# Patient Record
Sex: Male | Born: 1990 | Hispanic: No | Marital: Single | Smoking: Never smoker
Health system: Southern US, Community
[De-identification: ages and names within clinical notes are randomized; demographics above are authoritative.]

## PROBLEM LIST (undated history)

## (undated) DIAGNOSIS — F329 Major depressive disorder, single episode, unspecified: Secondary | ICD-10-CM

## (undated) DIAGNOSIS — F32A Depression, unspecified: Secondary | ICD-10-CM

## (undated) DIAGNOSIS — F419 Anxiety disorder, unspecified: Secondary | ICD-10-CM

## (undated) HISTORY — DX: Anxiety disorder, unspecified: F41.9

## (undated) HISTORY — DX: Depression, unspecified: F32.A

## (undated) HISTORY — DX: Major depressive disorder, single episode, unspecified: F32.9

---
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
## 2016-02-21 ENCOUNTER — Encounter (HOSPITAL_COMMUNITY): Payer: Self-pay | Admitting: Psychiatry

## 2016-02-21 ENCOUNTER — Other Ambulatory Visit (HOSPITAL_COMMUNITY): Payer: BLUE CROSS/BLUE SHIELD | Attending: Psychiatry | Admitting: Licensed Clinical Social Worker

## 2016-02-21 DIAGNOSIS — F419 Anxiety disorder, unspecified: Secondary | ICD-10-CM | POA: Insufficient documentation

## 2016-02-21 DIAGNOSIS — F331 Major depressive disorder, recurrent, moderate: Secondary | ICD-10-CM | POA: Insufficient documentation
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
## 2016-02-21 NOTE — Progress Notes (Signed)
    Daily Group Progress Note  Program: IOP  Group Time: 9:00-11:00  Participation Level: Active  Behavioral Response: Appropriate  Type of Therapy:  Group Therapy  Summary of Progress: Pt. Was alert, smiled appropriately, engaged in the group process. Pt. Participated in discussion about wellness habits including sleep hygiene, nutrition, moderate exercise, and medication management. Pt. Shared with the group that he had bariatric surgery 1 1/2 years ago and how managing emotions withhold food post-surgery has been a significant challenge.       Shaune PollackBrown, Jennifer B, LPC

## 2016-02-21 NOTE — Progress Notes (Signed)
Daily Group Progress Note  Program: IOP  Group Time: 10:45-12:00pm  Participation Level: Active  Behavioral Response: Appropriate  Type of Therapy:  Psychotherapy  Summary of Progress:  Pt was new in group today.  Pt participated in a discussion on healthy boundaries. Pt was encouraged to set personal boundaries with limits and rules, which encourages mental wellness.

## 2016-02-22 ENCOUNTER — Other Ambulatory Visit (HOSPITAL_COMMUNITY): Payer: BLUE CROSS/BLUE SHIELD | Attending: Medical | Admitting: Licensed Clinical Social Worker

## 2016-02-22 DIAGNOSIS — F331 Major depressive disorder, recurrent, moderate: Secondary | ICD-10-CM | POA: Insufficient documentation

## 2016-02-22 NOTE — Progress Notes (Signed)
Daily Group Progress Note  Program: IOP  Group Time: 10:45-12:00pm  Participation Level: Active  Behavioral Response: Appropriate  Type of Therapy:  Psychotherapy  Summary of Progress:  Pt participated in chair yoga, facilitated by [facility] therapist and certified yoga instructor Leanne Yates. The activity focused on breath with movement. For patients with anxiety and depression as part of self-care and mindfulness, yoga helps train the relaxation response.    Lisbeth S. Mackenzie, LCAS-A   

## 2016-02-22 NOTE — Progress Notes (Signed)
    Daily Group Progress Note  Program: IOP   Time:9:00-10:45 Activity Level: active Behavioral Response: engaged, responsive Type: Group Therapy Summary: Pt. demonstrated a theme of anxiety today. He also shared his anxiety of how he is perceived in the gym, and the affects of living in a co- dependent family. Patient stated that he did not enjoy anything in his current environment. Therapist concluded that he desired joy in his life. Therapist shared that we should experience a full range of emotions and not get stuck in any of them.   Boneta LucksJennifer Lynnette Pote, Ph.D., [facility]

## 2016-02-23 ENCOUNTER — Other Ambulatory Visit (HOSPITAL_COMMUNITY): Payer: BLUE CROSS/BLUE SHIELD | Admitting: Psychiatry

## 2016-02-23 DIAGNOSIS — F331 Major depressive disorder, recurrent, moderate: Secondary | ICD-10-CM | POA: Diagnosis not present

## 2016-02-24 ENCOUNTER — Other Ambulatory Visit (HOSPITAL_COMMUNITY): Payer: BLUE CROSS/BLUE SHIELD | Admitting: Psychiatry

## 2016-02-24 DIAGNOSIS — F331 Major depressive disorder, recurrent, moderate: Secondary | ICD-10-CM | POA: Diagnosis not present

## 2016-02-24 NOTE — Progress Notes (Signed)
    Daily Group Progress Note  Program: IOP     Group Time: 1100-1200  Participation Level:  Active  Behavioral Response: Appropriate and Sharing  Type of Therapy: Psycho-education Group  Summary of Progress: Feelings Jenga:  Pt actively participated in the game.  He pulled Jenga blocks, read the emotion listed and shared a time when he felt that emotion.  Pt recalled a time he felt ashamed of himself.  He mentioned before he had the wt surgery he felt so ashamed of his body.  "I was wearing a size 5X shirt.  I'm down to a 3X but I want to continue to lose even more."  CLARK, RITA, M.Ed, CNA

## 2016-02-27 ENCOUNTER — Other Ambulatory Visit (HOSPITAL_COMMUNITY): Payer: BLUE CROSS/BLUE SHIELD | Admitting: Licensed Clinical Social Worker

## 2016-02-27 DIAGNOSIS — F331 Major depressive disorder, recurrent, moderate: Secondary | ICD-10-CM

## 2016-02-27 NOTE — Progress Notes (Signed)
    Daily Group Progress Note  Program: IOP  Time: 9:00-12:00 Activity Level: active Behavioral Response: engaged, responsive Type: Group Therapy Summary: Patient presented with a positive theme in group today. Patient shared that he wanted to change his career, but did not know what he would pursue. He also stated that he had a goal to go back to school, but he was worried about the money. Therapist shared about self care, and how to incorporate that in our daily life. Therapist also educated group about I statements.   Shaune Pollack, LPC

## 2016-02-28 ENCOUNTER — Other Ambulatory Visit (HOSPITAL_COMMUNITY): Payer: BLUE CROSS/BLUE SHIELD | Admitting: Licensed Clinical Social Worker

## 2016-02-28 DIAGNOSIS — F331 Major depressive disorder, recurrent, moderate: Secondary | ICD-10-CM

## 2016-02-28 NOTE — Progress Notes (Signed)
    Daily Group Progress Note  Program: IOP  Time: 9:00-12:00 Activity Level: active Behavioral Response: engaged, responsive Type: Group Therapy Summary: Patient is very insightful in group. Patient shared that he has low self esteem, and often feels inadequate in making decisions. Therapist encouraged group with positive psychology techniques, to incorporate positivity into their daily lives.  Shaune Pollack, LPC

## 2016-02-28 NOTE — Progress Notes (Signed)
Daily Group Progress Note       IOP    Group Time: 9:00-12:00  Activity Level: active  Behavioral Response: engaged, responsive  Type of Therapy: Psychoeducation/Group  Summary: Patient was involved with discussion about current medication. Patient listened to suggestions of pharmacist. Patient shared that he had a very positive weekend. Group reviewed group expectations.     Vernona Rieger, LCAS-A

## 2016-02-29 ENCOUNTER — Other Ambulatory Visit (HOSPITAL_COMMUNITY): Payer: BLUE CROSS/BLUE SHIELD | Admitting: Psychiatry

## 2016-02-29 ENCOUNTER — Telehealth (HOSPITAL_COMMUNITY): Payer: Self-pay | Admitting: Psychiatry

## 2016-02-29 NOTE — Progress Notes (Signed)
Daily Group Progress Note     Program: IOP    Time: 10:45-12:00 Activity Level: active Behavioral Response: engaged, responsive Type: Group Summary:  Therapist asked group to share what others told them about their depression. Therapist asked the group to draw their depression, and to potentially share the drawing with those people. Patient shared his drawing of being at the bottom of a mountain, and wanting to climb to the top. He shared that he could get to the top by finding out what his passion is.

## 2016-02-29 NOTE — Progress Notes (Signed)
    Daily Group Progress Note  Program: IOP  Time: 9:00-10:45 Activity Level: active Behavioral Response: engaged, responsive Type: Group Therapy Summary: Patient discussed having problems sleeping. Therapist educated group about the importance of sleep hygiene, and eating a proper diet. Therapist encouraged group to incorporate mindfulness in their eating habits.   Shaune Pollack, LPC

## 2016-03-01 ENCOUNTER — Other Ambulatory Visit (HOSPITAL_COMMUNITY): Payer: BLUE CROSS/BLUE SHIELD | Admitting: Psychiatry

## 2016-03-01 DIAGNOSIS — F331 Major depressive disorder, recurrent, moderate: Secondary | ICD-10-CM

## 2016-03-02 ENCOUNTER — Other Ambulatory Visit (HOSPITAL_COMMUNITY): Payer: BLUE CROSS/BLUE SHIELD | Admitting: Psychiatry

## 2016-03-02 DIAGNOSIS — F331 Major depressive disorder, recurrent, moderate: Secondary | ICD-10-CM | POA: Diagnosis not present

## 2016-03-02 NOTE — Progress Notes (Signed)
    Daily Group Progress Note  Program: IOP   Time: 9:00-12:00 Activity Level: active Behavioral Response: engaged, responsive Type: Group Therapy Summary: Patient presented with an inferior theme today. Patient shared that he recieved feedback from his boss, that made him feel that he was not "good enough." He shared that he cut himself as a result, and started seeing a psychiatrist. Therapist shared that we often do the best we can with the resources that are available to [place]. Patient also shared that he struggled with co dependency in relationships. Therapist shared that as we get well, those people often will become anxious and attempt to interrupt the positive changes being made.  Shaune Pollack, LPC

## 2016-03-02 NOTE — Progress Notes (Signed)
Bryce Dillon is a 25 y.o. male patient, who arrived this morning voicing SI, no plan or intent within the past 24 hrs.  Reports having an argument with his brother lastnight.  States his brother told him that he needed to snap out of his depression.  Pt states he became so angry and depressed that he thought of cutting, but didn't.  Discussed safety options at length with pt.  Also, discussed inpatient admission with pt.  Pt declined.  States he will be with his mother and family the whole weekend.  Reiterated self care and structuring his weekend with pt.  Pt was able to contract for safety.  A:  Provided pt with support.  Informed Dr. Ladona Ridgel.  Encouraged support groups.  R:  Pt receptive.         Chestine Spore, RITA, M.Ed, CNA

## 2016-03-04 NOTE — Progress Notes (Signed)
    Daily Group Progress Note  Program: IOP   Time: 9:00-12:00 Activity Level: active Behavioral Response: engaged, responsive Type: Group Therapy Summary: Patient participated in discussion about the use of aromatherapy to complement treatment for depression and anxiety. Pt. presented with a very depressed theme in group. Patient shared that his family questions him daily about his group experience , and he shared that he does not want to talk to them about it. Therapist mentioned using "I" statements with his family, and communicating his needs to them. Patient also shared that he was dreading going back to work next week. Therapist encouraged client to research other job opportunities in his area, and to set an exit strategy in place.  Patient shared very encouraging words to the group members who were graduating.   Shaune Pollack, LPC

## 2016-03-05 ENCOUNTER — Other Ambulatory Visit (HOSPITAL_COMMUNITY): Payer: BLUE CROSS/BLUE SHIELD | Admitting: Licensed Clinical Social Worker

## 2016-03-05 DIAGNOSIS — F331 Major depressive disorder, recurrent, moderate: Secondary | ICD-10-CM
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
## 2016-03-06 ENCOUNTER — Other Ambulatory Visit (HOSPITAL_COMMUNITY): Payer: BLUE CROSS/BLUE SHIELD | Attending: Medical | Admitting: Licensed Clinical Social Worker

## 2016-03-06 DIAGNOSIS — F331 Major depressive disorder, recurrent, moderate: Secondary | ICD-10-CM | POA: Insufficient documentation

## 2016-03-06 NOTE — Progress Notes (Signed)
Patient ID: Bryce Dillon, male   DOB: [birth year], 25 y.o.   MRN: [id number]  Bryce Dillon says the group experience was helpful.  Will return to work tomorrow.  Feels better able to tolerate the stress and plans to find another job.  Mental status revealed no suicidal thoughts or wish to cut himself  Diagnosis remains Major depression, recurrent, moderate  Plan:  Has appointments with his therapist and psychiatrist.  He may want to change therapist as he wishes to be seen more often

## 2016-03-06 NOTE — Patient Instructions (Addendum)
Patient completed MH-IOP today.  Will follow up with Dr. Sandria Manly on 03-31-16 and [facility], LCSW on 03-15-16.  Encouraged support groups.  Recommended Aftercare Group every Tuesday 5-6 pm.  Return to work tomorrow, without any restrictions.

## 2016-03-06 NOTE — Progress Notes (Signed)
Bryce Dillon is a 25 y.o. , single, employed, Caucasian male, who was referred per his therapist (Monique Crutchfield, LCSW); treatment for worsening depressive and anxiety symptoms.  Admitted to symptoms worsening past month.  Pt reported SI, but denied a plan or intent upon admission.  Pt currently denies SI.  Also, denies HI or A/V hallucinations.  Pt has been out of work on [employer] since June 2017.  Stressors:  1)  Job [facility] [employer] [employer]) of 3 1/2 yrs.  Pt states he works within [employer] and it is very stressful.  "They are putting so much pressure on raising money."  Pt reports that he is currently looking at other options.  Has interviewed with [employer], but decided not to accept position d/t lower pay.  2)  Body Self Image Issues:  In 2016, pt had gastric bypass surgery.  Pt weighed at 415lbs before the surgery.  States although he has lost 110 lbs after the surgery, he wants to get down to 265 lbs.  "Whenever I see myself in the mirror I don't see myself as being smaller, I still see myself as 415 lbs.  3)  Strained finances:  Pt feels that since he has been out of work, he hasn't been able to contribute as much to the bills.  His mother had gastric bypass surgery last month and is currently out of work.  Pt denies any prior psych hospitalizations.  Admits to hx of self injurious behaviors:  cutting.  Most recent cutting was on his rt arm in June 2017.  Also mentioned he use to scratch his forehead before he started cutting.  Pt admits to having urges to cut recently, but was able to distract himself.  Family hx:  Father (ETOH). Pt  Completed MH-IOP today.  Reports that the groups were helpful.  "It was good listening to everyone else and learning coping skills that I can use."  Pt is planning to rtw tomorrow.  Admits to feeling anxious about returning. Pt has plans to take classes at a community college.  He states he went to [place], [place] over the weekend and took his brother  with him.  Reports having a lot of fun. A:  D/C today.  F/U with Dr. Sandria Manly on 03-31-16 and [facility], LCSW on 03-15-16.  Encouraged support groups.  Recommended Aftercare Group every Tuesday (5-6 pm).  *Copay fee.  R:  Pt receptive.  Carlis Abbott, RITA, M.Ed, CNA

## 2016-03-07 ENCOUNTER — Other Ambulatory Visit (HOSPITAL_COMMUNITY): Payer: BLUE CROSS/BLUE SHIELD

## 2016-03-07 NOTE — Progress Notes (Signed)
    Daily Group Progress Note  Program: IOP Group Time: 9 AM - 12  Participation Level:  Active  Behavioral Response: Appropriate  Type of Therapy: Group Therapy  Summary of Progress: [name] presented with a sad theme in group. Patient stated that he was really sad to leave group, and that he wished that he could stay longer. Patient shared that he was struggling going back to work, and that he only wanted to be there temporarily. Patient also shared that he wanted to learn how to decompress after a long day at work. Therapist encouraged patient to put an exit strategy in place, and to incorporate daily self-care as part of his routine. Patient participated in chair yoga, facilitated by [facility] therapist and certified yoga instructor Forde Radon. The activity focused on breath and movement. As part of self-care and mindfulness, yoga helps train the relaxation response for patients with anxiety and depression.    Donia Guiles, LCSW

## 2016-03-08 ENCOUNTER — Other Ambulatory Visit (HOSPITAL_COMMUNITY): Payer: BLUE CROSS/BLUE SHIELD

## 2016-03-09 ENCOUNTER — Other Ambulatory Visit (HOSPITAL_COMMUNITY): Payer: BLUE CROSS/BLUE SHIELD

## 2016-03-12 ENCOUNTER — Other Ambulatory Visit (HOSPITAL_COMMUNITY): Payer: BLUE CROSS/BLUE SHIELD

## 2016-03-13 ENCOUNTER — Other Ambulatory Visit (HOSPITAL_COMMUNITY): Payer: BLUE CROSS/BLUE SHIELD

## 2017-04-01 ENCOUNTER — Ambulatory Visit (HOSPITAL_COMMUNITY): Payer: BLUE CROSS/BLUE SHIELD | Admitting: Psychiatry

## 2018-09-16 ENCOUNTER — Ambulatory Visit (INDEPENDENT_AMBULATORY_CARE_PROVIDER_SITE_OTHER): Payer: No Typology Code available for payment source | Admitting: Psychiatry

## 2018-09-16 DIAGNOSIS — F411 Generalized anxiety disorder: Secondary | ICD-10-CM

## 2018-09-16 DIAGNOSIS — F329 Major depressive disorder, single episode, unspecified: Secondary | ICD-10-CM | POA: Diagnosis not present

## 2018-09-16 DIAGNOSIS — F32A Depression, unspecified: Secondary | ICD-10-CM

## 2018-09-16 MED ORDER — TRAZODONE HCL 50 MG PO TABS: 50.0000 mg | ORAL_TABLET | Freq: Every day | ORAL | 1 refills | Status: DC

## 2018-09-16 MED ORDER — BREXPIPRAZOLE 2 MG PO TABS: 2.0000 mg | ORAL_TABLET | Freq: Every day | ORAL | 1 refills | Status: DC

## 2018-09-16 NOTE — Progress Notes (Signed)
[facility] MD/PA/NP Initial Note  09/16/2018 9:26 AM Bryce Dillon  MRN:  [id number]  Chief Complaint: anxiety and depression  HPI:  today with anxiety and depression.  Anxiety started in high school, is worse in the last several years.  Currently it is constant.  Being indecisive is a symptom.  He denies panic attacks. He has had depression for 3 or 4 years and is worse over the last 1 year.  Decreased energy,isolates, eating is off, sleep onset problems.  Has problem with focusing and with anhedonia.  Episodes of depression last about a week.  He has passive suicidal thoughts for the last 6 months.  He has these thoughts 2-3 times a week.  Suicidal thoughts are no worse.  He has cut in the past but no suicidal ideation.   Symptoms manic he is in a good mood his mind is racing he is impulsive with online purchasing.  He talks more.  No decreased sleep, no grandiosity,.  He is goal oriented.  These last episodes last less than a day.  His MDQ was positive with a minor problem.  Does not remember the last time he was in a good mood. He has obsessive thoughts he loves board games.  He wants to get better from this. Psychosis negative. ADD ADHD never treated.  He notices his mind races.  Angry and irritable or not problems. Currently he feels the Cymbalta is not helping we did add Rexulti several months ago and is improved since then Visit Diagnosis: anxiety and depression.  Past Psychiatric History: anxiety and depression he has been followed by dr.Love and currently sees counselor Monique [facility]utch[facility]  Past Medical History: bariatric surgery  Past Medical History:  Diagnosis Date  . Anxiety   . Depression    No past surgical history on file.  Family Psychiatric History: Father has had anxiety.  Mother and father both have had depression.  father has had problems with alcohol abuse.  Family History: Grandmother has had MI.  Grandfather died of a  massive heart attack.  Mother has a history of cardiac dysrhythmias.  Strokes grandmother. Family History  Problem Relation Age of Onset  . 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 He currently drinks 12 ounces of caffeine a day.  He has a glass of wine 3 times a week.  He has had nohistory of drug use and only mild alcohol. Medications he is on Cymbalta 120 mg a day, trazodone 50 mg at bedtime, modafinil 100 mg a day, Rexulti 1 mg a day for 3 to 4 months. He sleeps well does have sleep onset problems. Social History   Socioeconomic History  . Marital status: Single    Spouse name: Not on file  . Number of children: Not on file  . Years of education: Not on file  . Highest education level: Not on file  Occupational History  . Not on file  Social Needs  . Financial resource strain: Not on file  . Food insecurity:    Worry: Not on file    Inability: Not on file  . Transportation needs:    Medical: Not on file    Non-medical: Not on file  Tobacco Use  . Smoking status: Never Smoker  Substance and Sexual Activity  . Alcohol use: No  . Drug use: No  . Sexual activity: Never  Lifestyle  .  Physical activity:    Days per week: Not on file    Minutes per session: Not on file  . Stress: Not on file  Relationships  . Social connections:    Talks on phone: Not on file    Gets together: Not on file    Attends religious service: Not on file    Active member of club or organization: Not on file    Attends meetings of clubs or organizations: Not on file    Relationship status: Not on file  Other Topics Concern  . Not on file  Social History Narrative  . Not on file    Allergies: No Known Allergies  Metabolic Disorder Labs: No results found for: HGBA1C, MPG No results found for: PROLACTIN No results found for: CHOL, TRIG, HDL, CHOLHDL, VLDL, LDLCALC No results found for: TSH  Therapeutic Level  Labs: No results found for: LITHIUM No results found for: VALPROATE No components found for:  CBMZ  Current Medications: Current Outpatient Medications  Medication Sig Dispense Refill  . ARIPiprazole (ABILIFY) 2 MG tablet Take 2 mg by mouth daily. Take half tab for one week, then increase to a whole tablet    . clonazePAM (KLONOPIN) 0.5 MG tablet Take 0.5 mg by mouth at bedtime.    . DULoxetine (CYMBALTA) 60 MG capsule Take 60 mg by mouth 2 (two) times daily.     No current facility-administered medications for this visit.     Medication Side Effects: none  Orders placed this visit:  Increase rexulti to 2mg /day Continue cymbalta 120mg /day Continue trazadone Continue modafinil  Psychiatric Specialty Exam: bp is 138/95 with pulse of 97 ROS  There were no vitals taken for this visit.There is no height or weight on file to calculate BMI.  General Appearance: Casual morbidly obesse  Eye Contact:  Good  Speech:  Clear and Coherent  Volume:  Increased  Mood:  Depressed  Affect:  Appropriate  Thought Process:  Linear  Orientation:  Full (Time, Place, and Person)  Thought Content: Logical   Suicidal Thoughts:  No  Homicidal Thoughts:  No  Memory:  WNL  Judgement:  Good  Insight:  Good  Psychomotor Activity:  Normal  Concentration:  Concentration: Good  Recall:  Good  Fund of Knowledge: Good  Language: Good  Assets:  Desire for Improvement  ADL's:  Intact  Cognition: WNL  Prognosis:  Good   TD signs negative   Screenings:  MDQ + with only minor problems Receiving Psychotherapy: No   Mentioned Elio Forget Treatment Plan/Recommendations: Patient has depression and anxiety.  He does not feel like his Cymbalta is helping however when he started the Rexulti 1 mg several weeks back  he has had improvement.  Therefore we will continue his Cymbalta 120 mg a day, increase Rexulti 2 mg a day, continue trazodone 50 mg at bedtime, continue modafinil 100 mg at bedtime. We will get a  release of information from Dr. love his current psychiatrist.  Also for Monique Crutchfield his counselor.  Also [facility] 2017.  [facility].  He does commit to safety and she wanted see him back in 3 weeks. While he does notice biting his tongue his TD signs are negative. He is to start ginkgo biloba 120 mg twice a day.     Anne Fu, PA-C

## 2018-10-06 ENCOUNTER — Ambulatory Visit (INDEPENDENT_AMBULATORY_CARE_PROVIDER_SITE_OTHER): Payer: No Typology Code available for payment source | Admitting: Psychiatry

## 2018-10-06 DIAGNOSIS — F322 Major depressive disorder, single episode, severe without psychotic features: Secondary | ICD-10-CM

## 2018-10-06 MED ORDER — DULOXETINE HCL 30 MG PO CPEP: ORAL_CAPSULE | ORAL | 0 refills | Status: DC

## 2018-10-06 MED ORDER — SERTRALINE HCL 50 MG PO TABS: ORAL_TABLET | ORAL | 1 refills | Status: DC

## 2018-10-06 NOTE — Progress Notes (Signed)
[facility] Med Check  Patient ID: Bryce Dillon,  MRN: [id number]  PCP: Elijio Miles., MD  Date of Evaluation: 10/06/2018 Time spent:20 minutes  Chief Complaint:   HISTORY/CURRENT STATUS: HPI patient's initial visit was 09/16/2018.  He had anxiety and depression.  Passive suicidal thoughts for 6 months.  The Rexulti helped but Cymbalta did not.  At visit we increased his Rexulti.  We kept his trazodone the same.  And kept his other meds the same. Currently patient continues to have high anxiety and very depressed.  He denies active suicidal thoughts.  He gets down on himself he says he is not decisive, and decreased focus.  He also complains of night eating. Wonders if he is ADHD.  Individual Medical History/ Review of Systems: Changes? :No   Allergies: Patient has no known allergies.  Current Medications:  Current Outpatient Medications:  .  Brexpiprazole (REXULTI) 2 MG TABS, Take 2 mg by mouth daily., Disp: 30 tablet, Rfl: 1 .  traZODone (DESYREL) 50 MG tablet, Take 1 tablet (50 mg total) by mouth at bedtime., Disp: 30 tablet, Rfl: 1 Medication Side Effects: none  Family Medical/ Social History: Changes? No  MENTAL HEALTH EXAM:  There were no vitals taken for this visit.There is no height or weight on file to calculate BMI.  General Appearance: Casual obese  Eye Contact:  Fair  Speech:  Clear and Coherent  Volume:  Normal  Mood:  Depressed  Affect:  Appropriate  Thought Process:  Linear  Orientation:  Full (Time, Place, and Person)  Thought Content: Logical   Suicidal Thoughts:  No  Homicidal Thoughts:  No  Memory:  WNL  Judgement:  Fair  Insight:  Fair  Psychomotor Activity:  Normal  Concentration:  Concentration: Good  Recall:  Good  Fund of Knowledge: Good  Language: Good  Assets:  Desire for Improvement  ADL's:  Intact  Cognition: WNL  Prognosis:  Good    DIAGNOSES: No diagnosis found.  Receiving Psychotherapy: Yes  Monique  Crutchfield   RECOMMENDATIONS: I will start him on Zoloft 25 mg a day for a week and then 50 mg.  He is to decrease his Cymbalta by 30 mg every week until he is off.  He is to continue ginkgo biloba and I want to start NAC hopefully it will help denied any cravings There were 4 5 release of information signed Dr. love, Monique Crutchfield, [facility], [facility] [place].  We are waiting for these.  I will see him again in 3 weeks   Anne Fu, PA-C

## 2018-10-28 ENCOUNTER — Ambulatory Visit: Payer: No Typology Code available for payment source | Admitting: Psychiatry

## 2018-11-11 ENCOUNTER — Telehealth: Payer: Self-pay | Admitting: Psychiatry

## 2018-11-11 NOTE — Telephone Encounter (Signed)
Pt mom Bryce Dillon called ask for Rx Provigil. [facility] [place]  Call with any questions [phone]

## 2018-11-11 NOTE — Telephone Encounter (Signed)
Left voicemail for Mom to call back, doesn't look like Mat Carne has written for it. Last fill 08/2018 by another provider. Need clarification

## 2018-11-12 ENCOUNTER — Other Ambulatory Visit: Payer: Self-pay

## 2018-11-12 ENCOUNTER — Other Ambulatory Visit: Payer: Self-pay | Admitting: Physician Assistant

## 2018-11-12 MED ORDER — MODAFINIL 100 MG PO TABS: 100.0000 mg | ORAL_TABLET | Freq: Every day | ORAL | 0 refills | Status: DC

## 2018-11-12 NOTE — Telephone Encounter (Signed)
Mom called back and pt was receiving from previous provider but Mat Carne was to take over prescribing. Last fill was 08/2018. Will submit to provider for approval. See notes.

## 2018-11-12 NOTE — Telephone Encounter (Signed)
I read your other note after this one.  Sorry about that.  Okay, I'll send it in.

## 2018-11-12 NOTE — Telephone Encounter (Signed)
He's getting Modafinil from Dr Geraldine Contras, not [facility].  Rx denied.  Needs to call Dr Sandria Manly

## 2018-11-15 ENCOUNTER — Other Ambulatory Visit: Payer: Self-pay | Admitting: Psychiatry

## 2018-11-19 ENCOUNTER — Other Ambulatory Visit: Payer: Self-pay | Admitting: Psychiatry

## 2018-12-17 ENCOUNTER — Ambulatory Visit: Payer: No Typology Code available for payment source | Admitting: Psychiatry

## 2018-12-22 ENCOUNTER — Other Ambulatory Visit: Payer: Self-pay | Admitting: Physician Assistant

## 2018-12-23 ENCOUNTER — Telehealth: Payer: Self-pay | Admitting: Psychiatry

## 2018-12-23 ENCOUNTER — Other Ambulatory Visit: Payer: Self-pay

## 2018-12-23 NOTE — Telephone Encounter (Signed)
Patient's mother Britta Mccreedy stated he needs a refill on Modafinil 100 mg., tablets for Sleep Apnea that [facility] prescribed, patient been without medication for 3 days has appt., with Shanda Bumps on 06/02.  [facility] [place] [phone].

## 2018-12-23 NOTE — Telephone Encounter (Signed)
Has appt 06/02, last fill 04/08

## 2019-01-06 ENCOUNTER — Other Ambulatory Visit: Payer: Self-pay

## 2019-01-06 ENCOUNTER — Ambulatory Visit (INDEPENDENT_AMBULATORY_CARE_PROVIDER_SITE_OTHER): Payer: No Typology Code available for payment source | Admitting: Psychiatry

## 2019-01-06 ENCOUNTER — Encounter: Payer: Self-pay | Admitting: Psychiatry

## 2019-01-06 DIAGNOSIS — F411 Generalized anxiety disorder: Secondary | ICD-10-CM

## 2019-01-06 DIAGNOSIS — F331 Major depressive disorder, recurrent, moderate: Secondary | ICD-10-CM | POA: Diagnosis not present

## 2019-01-06 MED ORDER — MODAFINIL 100 MG PO TABS: 100.0000 mg | ORAL_TABLET | Freq: Every day | ORAL | 0 refills | Status: DC

## 2019-01-06 MED ORDER — SERTRALINE HCL 100 MG PO TABS: ORAL_TABLET | ORAL | 1 refills | Status: DC

## 2019-01-06 NOTE — Progress Notes (Signed)
Bryce Dillon [id number] 01/13/1991 28 y.o.   Virtual Visit via Video Note  I connected with Bryce Dillon on 01/06/19 at  9:00 AM EDT by a video enabled telemedicine application and verified that I am speaking with the correct person using two identifiers.  Location: Patient: Home Provider: [facility]   I discussed the limitations of evaluation and management by telemedicine and the availability of in person appointments. The patient expressed understanding and agreed to proceed.  I discussed the assessment and treatment plan with the patient. The patient was provided an opportunity to ask questions and all were answered. The patient agreed with the plan and demonstrated an understanding of the instructions.   The patient was advised to call back or seek an in-person evaluation if the symptoms worsen or if the condition fails to improve as anticipated.  I provided 30 minutes of non-face-to-face time during this encounter.   Corie ChiquitoJessica Brayan Votaw, PMHNP   Subjective:   Patient ID:  Bryce Dillon is a 28 y.o. (DOB 06/30/1991) male.  Chief Complaint:  Chief Complaint  Patient presents with  . Anxiety  . Depression    HPI Bryce Dillon presents for follow-up of anxiety and mood. He reports that he has not noticed any significant improvement in anxiety with change with Sertraline. He reports that his depression has improved some and that it is not as constant. He reports frequent worry. Reports rumination and regret about past decisions and this leads to difficulty making decisions currently. He reports some anxiety with meeting new people and denies anxiety in crowds. He reports occ chest pain and gagging with anxiety. He reports that he will clinch his teeth with anxiety. Denies panic attacks. He reports some intrusive thoughts at times. Reports anxiety is worse at night and interferes with sleep initiation. Can take 45-60 minutes to fall asleep. Reports adequate sleep overall. Reports  nightmares about 3-4 nights a week. He reports some weight gain in the past few months since the start of quarantine. He reports he has noticed an increase in food cravings. Occ binging. Energy and motivation have been low. He reports difficulty with concentration and has difficulty watching TV or playing games. Limited enjoyments. Denies SI.   Denies any h/o manic s/s.   Dog of 18 years recently died. 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  Past Psychiatric Medication Trials: Rexulti-  Has had tongue movements for several months. Some decrease in movements with addition of Gingko. Unsure of benefits.  Cymbalta Lexapro Buspar- Side effects (HA's, tired) Geodon Modafanil- unsure of benefit. Trazodone   Review of Systems:  Review of Systems  Musculoskeletal: Negative for gait problem.  Neurological: Negative for tremors.  Psychiatric/Behavioral:       Please refer to HPI    Medications: I have reviewed the patient's current medications.  Current Outpatient Medications  Medication Sig Dispense Refill  . [START ON 01/20/2019] modafinil (PROVIGIL) 100 MG tablet Take 1 tablet (100 mg total) by mouth daily. 30 tablet 0  . REXULTI 2 MG TABS TAKE 1 TABLET BY MOUTH EVERY DAY 30 tablet 1  . traZODone (DESYREL) 50 MG tablet TAKE 1 TABLET BY MOUTH EVERYDAY AT BEDTIME 30 tablet 1  . sertraline (ZOLOFT) 100 MG tablet Take 1 tab po qd for 7-10 days, then increase to 1.5 tabs po qd 45 tablet 1   No current facility-administered medications for this visit.     Medication Side Effects: Other: Involuntary tongue movements  Allergies: No Known Allergies  Past Medical History:  Diagnosis Date  . Anxiety   . Depression     Family History  Problem Relation Age of Onset  . Alcohol abuse Father     Social History   Socioeconomic History  . Marital status: Single    Spouse name: Not on file  . Number of children: Not on file  . Years of education: Not on file  .  Highest education level: Not on file  Occupational History  . Not on file  Social Needs  . Financial resource strain: Not on file  . Food insecurity:    Worry: Not on file    Inability: Not on file  . Transportation needs:    Medical: Not on file    Non-medical: Not on file  Tobacco Use  . Smoking status: Never Smoker  . Smokeless tobacco: Never Used  Substance and Sexual Activity  . Alcohol use: No  . Drug use: No  . Sexual activity: Never  Lifestyle  . Physical activity:    Days per week: Not on file    Minutes per session: Not on file  . Stress: Not on file  Relationships  . Social connections:    Talks on phone: Not on file    Gets together: Not on file    Attends religious service: Not on file    Active member of club or organization: Not on file    Attends meetings of clubs or organizations: Not on file    Relationship status: Not on file  . Intimate partner violence:    Fear of current or ex partner: Not on file    Emotionally abused: Not on file    Physically abused: Not on file    Forced sexual activity: Not on file  Other Topics Concern  . Not on file  Social History Narrative  . Not on file    Past Medical History, Surgical history, Social history, and Family history were reviewed and updated as appropriate.   Please see review of systems for further details on the patient's review from today.   Objective:   Physical Exam:  There were no vitals taken for this visit.  Physical Exam Neurological:     Mental Status: He is alert and oriented to person, place, and time.     Cranial Nerves: No dysarthria.  Psychiatric:        Attention and Perception: Attention normal.        Mood and Affect: Mood is anxious and depressed.        Speech: Speech normal.        Behavior: Behavior is cooperative.        Thought Content: Thought content normal. Thought content is not paranoid or delusional. Thought content does not include homicidal or suicidal ideation.  Thought content does not include homicidal or suicidal plan.        Cognition and Memory: Cognition and memory normal.        Judgment: Judgment normal.     Lab Review:  No results found for: NA, K, CL, CO2, GLUCOSE, BUN, CREATININE, CALCIUM, PROT, ALBUMIN, AST, ALT, ALKPHOS, BILITOT, GFRNONAA, GFRAA  No results found for: WBC, RBC, HGB, HCT, PLT, MCV, MCH, MCHC, RDW, LYMPHSABS, MONOABS, EOSABS, BASOSABS  No results found for: POCLITH, LITHIUM   No results found for: PHENYTOIN, PHENOBARB, VALPROATE, CBMZ   .res Assessment: Plan:   Discussed potential benefits, risks, and side effects of increasing sertraline.  Discussed that higher doses of sertraline are  often needed to adequately control anxiety signs and symptoms and that patient has experienced partial improvement at lower dose.  Patient agrees to increase in sertraline. Will increase sertraline to 100 mg daily for 7 to 10 days, then increase to 150 mg daily for anxiety and depression. Continue Rexulti 2 mg daily for depression and anxiety. Continue modafinil 100 mg daily for concentration and energy. Continue trazodone for insomnia. Patient to follow-up in 4 weeks or sooner if clinically indicated.  Generalized anxiety disorder - Plan: sertraline (ZOLOFT) 100 MG tablet  Moderate episode of recurrent major depressive disorder (HCC) - Plan: sertraline (ZOLOFT) 100 MG tablet, modafinil (PROVIGIL) 100 MG tablet  Please see After Visit Summary for patient specific instructions.  No future appointments.  No orders of the defined types were placed in this encounter.     -------------------------------

## 2019-01-12 ENCOUNTER — Other Ambulatory Visit: Payer: Self-pay

## 2019-01-12 MED ORDER — BREXPIPRAZOLE 2 MG PO TABS: 1.0000 | ORAL_TABLET | Freq: Every day | ORAL | 2 refills | Status: DC

## 2019-01-12 MED ORDER — TRAZODONE HCL 50 MG PO TABS: ORAL_TABLET | ORAL | 2 refills | Status: DC

## 2019-01-30 ENCOUNTER — Other Ambulatory Visit: Payer: Self-pay | Admitting: Psychiatry

## 2019-01-30 DIAGNOSIS — F411 Generalized anxiety disorder: Secondary | ICD-10-CM

## 2019-01-30 DIAGNOSIS — F331 Major depressive disorder, recurrent, moderate: Secondary | ICD-10-CM

## 2019-02-11 ENCOUNTER — Other Ambulatory Visit: Payer: Self-pay | Admitting: Psychiatry

## 2019-02-16 ENCOUNTER — Other Ambulatory Visit: Payer: Self-pay | Admitting: Psychiatry

## 2019-03-23 ENCOUNTER — Encounter: Payer: Self-pay | Admitting: Psychiatry

## 2019-03-23 ENCOUNTER — Ambulatory Visit (INDEPENDENT_AMBULATORY_CARE_PROVIDER_SITE_OTHER): Payer: No Typology Code available for payment source | Admitting: Psychiatry

## 2019-03-23 DIAGNOSIS — G4700 Insomnia, unspecified: Secondary | ICD-10-CM

## 2019-03-23 DIAGNOSIS — F331 Major depressive disorder, recurrent, moderate: Secondary | ICD-10-CM

## 2019-03-23 DIAGNOSIS — F411 Generalized anxiety disorder: Secondary | ICD-10-CM | POA: Diagnosis not present

## 2019-03-23 MED ORDER — SERTRALINE HCL 100 MG PO TABS: 200.0000 mg | ORAL_TABLET | Freq: Every day | ORAL | 0 refills | Status: DC

## 2019-03-23 MED ORDER — TRAZODONE HCL 50 MG PO TABS: 50.0000 mg | ORAL_TABLET | Freq: Every day | ORAL | 0 refills | Status: DC

## 2019-03-23 MED ORDER — BUPROPION HCL ER (XL) 150 MG PO TB24: 150.0000 mg | ORAL_TABLET | Freq: Every morning | ORAL | 1 refills | Status: DC

## 2019-03-23 NOTE — Progress Notes (Signed)
Bryce Dillon [id number] [birth year] 28 y.o.  Virtual Visit via Telephone Note  I connected with pt on 03/23/19 at  9:00 AM EDT by telephone and verified that I am speaking with the correct person using two identifiers.   I discussed the limitations, risks, security and privacy concerns of performing an evaluation and management service by telephone and the availability of in person appointments. I also discussed with the patient that there may be a patient responsible charge related to this service. The patient expressed understanding and agreed to proceed.   I discussed the assessment and treatment plan with the patient. The patient was provided an opportunity to ask questions and all were answered. The patient agreed with the plan and demonstrated an understanding of the instructions.   The patient was advised to call back or seek an in-person evaluation if the symptoms worsen or if the condition fails to improve as anticipated.  I provided 30 minutes of non-face-to-face time during this encounter.  The patient was located at home.  The provider was located at [place].   Thayer Headings, PMHNP   Subjective:   Patient ID:  Bryce Dillon is a 28 y.o. (DOB 1990-12-05) male.  Chief Complaint:  Chief Complaint  Patient presents with  . Depression  . Anxiety    HPI Bryce Dillon presents for follow-up of anxiety and depression.  He reports that he continues to experience some anxiety, typically at night. He reports social anxiety has improved some and is not as anxious about being around people. Continues to have frequent worry and rumination. He reports that gagging and chest pain with anxiety has decreased. Continues to clinch jaw and bite tonight. Denies panic attacks.    He reports frequent negative thoughts about self and ruminates, "I'm an idiot." Reports depressed mood. He reports that he has been stress eating. Occ binge eating. He reports that his sleep has been "ok" and  is sleeping more during the day. He reports that he falls asleep frequently during the day. Reports that he is sleeping excessively at times. Denies nightmares. Energy and motivation have been low. He reports concentration has been "very difficult" and has difficulty sustaining focus, even when watching TV. Denies anhedonia. Denies SI.   He reports that he has made several excessive, impulsive purchases. He denies any other impulsivity. Has also been spending more on trading cards. Reports that this may have started around the time of Rexulti.   Past Psychiatric Medication Trials: Rexulti-  Has had tongue movements for several months. Some decrease in movements with addition of Gingko. Unsure of benefits.  Abilify Cymbalta Lexapro Buspar- Side effects (HA's, tired) Geodon Modafanil- unsure of benefit. Trazodone Xanax  Review of Systems:  Review of Systems  Musculoskeletal: Negative for gait problem.  Neurological: Negative for tremors.       Involuntary tongue movements.   Psychiatric/Behavioral:       Please refer to HPI    Medications: I have reviewed the patient's current medications.  Current Outpatient Medications  Medication Sig Dispense Refill  . sertraline (ZOLOFT) 100 MG tablet Take 2 tablets (200 mg total) by mouth daily. 180 tablet 0  . traZODone (DESYREL) 50 MG tablet Take 1 tablet (50 mg total) by mouth at bedtime. 90 tablet 0  . buPROPion (WELLBUTRIN XL) 150 MG 24 hr tablet Take 1 tablet (150 mg total) by mouth every morning. 30 tablet 1   No current facility-administered medications for this visit.     Medication Side Effects: Other:  Involuntary tongue movements. Possible impulsive spending He reports worsening involuntary tongue movements.    Allergies: No Known Allergies  Past Medical History:  Diagnosis Date  . Anxiety   . Depression     Family History  Problem Relation Age of Onset  . Alcohol abuse Father     Social History   Socioeconomic  History  . Marital status: Single    Spouse name: Not on file  . Number of children: Not on file  . Years of education: Not on file  . Highest education level: Not on file  Occupational History  . Not on file  Social Needs  . Financial resource strain: Not on file  . Food insecurity    Worry: Not on file    Inability: Not on file  . Transportation needs    Medical: Not on file    Non-medical: Not on file  Tobacco Use  . Smoking status: Never Smoker  . Smokeless tobacco: Never Used  Substance and Sexual Activity  . Alcohol use: No  . Drug use: No  . Sexual activity: Never  Lifestyle  . Physical activity    Days per week: Not on file    Minutes per session: Not on file  . Stress: Not on file  Relationships  . Social Musicianconnections    Talks on phone: Not on file    Gets together: Not on file    Attends religious service: Not on file    Active member of club or organization: Not on file    Attends meetings of clubs or organizations: Not on file    Relationship status: Not on file  . Intimate partner violence    Fear of current or ex partner: Not on file    Emotionally abused: Not on file    Physically abused: Not on file    Forced sexual activity: Not on file  Other Topics Concern  . Not on file  Social History Narrative  . Not on file    Past Medical History, Surgical history, Social history, and Family history were reviewed and updated as appropriate.   Please see review of systems for further details on the patient's review from today.   Objective:   Physical Exam:  There were no vitals taken for this visit.  Physical Exam Neurological:     Mental Status: He is alert and oriented to person, place, and time.     Cranial Nerves: No dysarthria.  Psychiatric:        Attention and Perception: Attention normal.        Mood and Affect: Mood is anxious and depressed.        Speech: Speech normal.        Behavior: Behavior is cooperative.        Thought Content:  Thought content normal. Thought content is not paranoid or delusional. Thought content does not include homicidal or suicidal ideation. Thought content does not include homicidal or suicidal plan.        Cognition and Memory: Cognition and memory normal.        Judgment: Judgment normal.     Lab Review:  No results found for: NA, K, CL, CO2, GLUCOSE, BUN, CREATININE, CALCIUM, PROT, ALBUMIN, AST, ALT, ALKPHOS, BILITOT, GFRNONAA, GFRAA  No results found for: WBC, RBC, HGB, HCT, PLT, MCV, MCH, MCHC, RDW, LYMPHSABS, MONOABS, EOSABS, BASOSABS  No results found for: POCLITH, LITHIUM   No results found for: PHENYTOIN, PHENOBARB, VALPROATE, CBMZ   .res Assessment: Plan:  Discussed stopping Rexulti since patient reports having involuntary tongue movements and increase in impulsive spending since starting Rexulti.  Discussed that impulsive behaviors are a rare side effect with partial dopamine agonists and could be related to Rexulti.  Patient agrees with plan to stop Rexulti.  Advised that he could stop without needing to titrate due to the long long half-life of Rexulti. Discussed potential benefits, risks, and side effects of Wellbutrin.  Patient agrees to trial of Wellbutrin XL.  Will start Wellbutrin XL 150 mg p.o. every morning to improve depression, energy, motivation, and concentration. Will increase sertraline to 200 mg daily to improve depression and anxiety since patient has experienced some improvement in signs and symptoms with lower doses without any tolerability issues. Will discontinue modafinil since patient reports that he has not taken it recently due to limited benefit. Patient to follow-up in 4 weeks or sooner if clinically indicated.   Patient advised to contact office with any questions, adverse effects, or acute worsening in signs and symptoms.  Bryce Dillon was seen today for depression and anxiety.  Diagnoses and all orders for this visit:  Insomnia, unspecified type -      traZODone (DESYREL) 50 MG tablet; Take 1 tablet (50 mg total) by mouth at bedtime.  Generalized anxiety disorder -     sertraline (ZOLOFT) 100 MG tablet; Take 2 tablets (200 mg total) by mouth daily.  Moderate episode of recurrent major depressive disorder (HCC) -     sertraline (ZOLOFT) 100 MG tablet; Take 2 tablets (200 mg total) by mouth daily. -     buPROPion (WELLBUTRIN XL) 150 MG 24 hr tablet; Take 1 tablet (150 mg total) by mouth every morning.    Please see After Visit Summary for patient specific instructions.  No future appointments.  No orders of the defined types were placed in this encounter.     -------------------------------

## 2019-04-22 ENCOUNTER — Telehealth: Payer: Self-pay | Admitting: Psychiatry

## 2019-04-22 DIAGNOSIS — F32A Depression, unspecified: Secondary | ICD-10-CM

## 2019-04-22 DIAGNOSIS — F329 Major depressive disorder, single episode, unspecified: Secondary | ICD-10-CM

## 2019-04-22 MED ORDER — LITHIUM CARBONATE 150 MG PO CAPS: ORAL_CAPSULE | ORAL | 1 refills | Status: DC

## 2019-04-22 NOTE — Telephone Encounter (Signed)
Pt called having trouble sleeping and suicidal thoughts, but not acting on them. Last new med is Wellbutrin. [phone] Please call

## 2019-04-22 NOTE — Telephone Encounter (Signed)
He reports that these side effects have significantly worse this week. Denies suicidal intent or plan. Describes intrusive thoughts and will repeatedly think, "I want to die, I want to die."  Patient reports that he has history of intrusive suicidal thoughts but typically not to this degree.  Insomnia has been significantly worse. Having difficulty falling asleep.  Has not noticed any significant benefit yet with Wellbutrin XL since starting it about 3 weeks ago. Discussed potential benefits, risks, and side effects of lithium and patient agrees to trial of lithium.  Will start lithium 150 mg p.o. nightly x3 to 5 days, then increase to 2 capsules p.o. nightly for depression and chronic suicidal thoughts. Patient advised to contact office with any questions, adverse effects, or acute worsening in signs and symptoms.

## 2019-04-30 ENCOUNTER — Other Ambulatory Visit: Payer: Self-pay

## 2019-04-30 ENCOUNTER — Encounter: Payer: Self-pay | Admitting: Psychiatry

## 2019-04-30 ENCOUNTER — Ambulatory Visit (INDEPENDENT_AMBULATORY_CARE_PROVIDER_SITE_OTHER): Payer: No Typology Code available for payment source | Admitting: Psychiatry

## 2019-04-30 VITALS — Wt >= 6400 oz

## 2019-04-30 DIAGNOSIS — F411 Generalized anxiety disorder: Secondary | ICD-10-CM | POA: Diagnosis not present

## 2019-04-30 DIAGNOSIS — F331 Major depressive disorder, recurrent, moderate: Secondary | ICD-10-CM

## 2019-04-30 DIAGNOSIS — F988 Other specified behavioral and emotional disorders with onset usually occurring in childhood and adolescence: Secondary | ICD-10-CM

## 2019-04-30 DIAGNOSIS — G4700 Insomnia, unspecified: Secondary | ICD-10-CM | POA: Diagnosis not present

## 2019-04-30 MED ORDER — TRAZODONE HCL 50 MG PO TABS: 50.0000 mg | ORAL_TABLET | Freq: Every day | ORAL | 0 refills | Status: DC

## 2019-04-30 MED ORDER — METHYLPHENIDATE HCL ER (OSM) 27 MG PO TBCR: 27.0000 mg | EXTENDED_RELEASE_TABLET | ORAL | 0 refills | Status: DC

## 2019-04-30 NOTE — Progress Notes (Signed)
Bryce Dillon [id number] 1991/06/15 28 y.o.  Virtual Visit via Video Note  I connected with pt @ on 04/30/19 at 10:00 AM EDT by a video enabled telemedicine application and verified that I am speaking with the correct person using two identifiers.   I discussed the limitations of evaluation and management by telemedicine and the availability of in person appointments. The patient expressed understanding and agreed to proceed.  I discussed the assessment and treatment plan with the patient. The patient was provided an opportunity to ask questions and all were answered. The patient agreed with the plan and demonstrated an understanding of the instructions.   The patient was advised to call back or seek an in-person evaluation if the symptoms worsen or if the condition fails to improve as anticipated.  I provided 25 minutes of non-face-to-face time during this encounter.  The patient was located at home.  The provider was located at [facility].   Corie Chiquito, PMHNP   Subjective:   Patient ID:  Bryce Dillon is a 28 y.o. (DOB August 15, 1990) male.  Chief Complaint:  Chief Complaint  Patient presents with  . Other    Impaired concentration. low motivation    HPI Bryce Dillon presents for follow-up of depression and anxiety. Pt's mother is present for part of the video visit. Pt reports that suicidal thoughts have decreased in frequency and severity. Some improvement in mood with continued persistent depression. Rates depression a 6/10 with 10= most depressed imaginable. He reports that his insomnia has improved slightly. He continues to have intermittent awakenings. Estimates sleeping 8 hours a night. He reports that anxiety has been better. No recent physical s/s with anxiety. Denies panic attacks. He reports that he has a compulsion to hum when falling asleep. He reports that racing thoughts have decreased since stopping wellbutrin XL and are still present. He reports that he has  been having significant difficulty with concentration and staying on task. Reports that school performance has been ok. He reports that his appetite has been slightly less and denies any recent binge eating. Energy and motivation remain low. Feelings of hopelessness. Denies suicidal intent or plan.   He reports that he has not had any impulsive purchases since stopping Rexulti. He reports that his tongue movements seem to be less frequent and tremor has resolved. He reports some jaw clinching at ties with anxiety.   Noticed difficulty with concentration in high school. Reports concentration was manageable in college and has been worse over the past few years. He reports difficulty staying focused, even on things he enjoys. Has difficulty sitting through TV programs. He and his mom report that he frequently loses and misplaces things. Has difficulty tracking appointments. Starts multiple tasks without completing them.   Past Psychiatric Medication Trials: Rexulti- Has had tongue movements for several months. Some decrease in movements with addition of Gingko. Unsure of benefits.  Abilify Cymbalta Lexapro Buspar- Side effects (HA's, tired) Geodon Modafanil- unsure of benefit. Trazodone Xanax  Review of Systems:  Review of Systems  Gastrointestinal: Negative.   Musculoskeletal: Negative for gait problem.       Knee pain  Neurological: Positive for headaches. Negative for tremors.  Psychiatric/Behavioral:       Please refer to HPI    Medications: I have reviewed the patient's current medications.  Current Outpatient Medications  Medication Sig Dispense Refill  . lithium carbonate 150 MG capsule Take 1 capsule po QHS x 3-5 days, then increase to 2 capsules po QHS 60 capsule 1  .  sertraline (ZOLOFT) 100 MG tablet Take 2 tablets (200 mg total) by mouth daily. 180 tablet 0  . traZODone (DESYREL) 50 MG tablet Take 1 tablet (50 mg total) by mouth at bedtime. 90 tablet 0  . methylphenidate  (CONCERTA) 27 MG PO CR tablet Take 1 tablet (27 mg total) by mouth every morning. 30 tablet 0   No current facility-administered medications for this visit.     Medication Side Effects: None  Allergies: No Known Allergies  Past Medical History:  Diagnosis Date  . Anxiety   . Depression     Family History  Problem Relation Age of Onset  . Alcohol abuse Father     Social History   Socioeconomic History  . Marital status: Single    Spouse name: Not on file  . Number of children: Not on file  . Years of education: Not on file  . Highest education level: Not on file  Occupational History  . Not on file  Social Needs  . Financial resource strain: Not on file  . Food insecurity    Worry: Not on file    Inability: Not on file  . Transportation needs    Medical: Not on file    Non-medical: Not on file  Tobacco Use  . Smoking status: Never Smoker  . Smokeless tobacco: Never Used  Substance and Sexual Activity  . Alcohol use: No  . Drug use: No  . Sexual activity: Never  Lifestyle  . Physical activity    Days per week: Not on file    Minutes per session: Not on file  . Stress: Not on file  Relationships  . Social Musicianconnections    Talks on phone: Not on file    Gets together: Not on file    Attends religious service: Not on file    Active member of club or organization: Not on file    Attends meetings of clubs or organizations: Not on file    Relationship status: Not on file  . Intimate partner violence    Fear of current or ex partner: Not on file    Emotionally abused: Not on file    Physically abused: Not on file    Forced sexual activity: Not on file  Other Topics Concern  . Not on file  Social History Narrative  . Not on file    Past Medical History, Surgical history, Social history, and Family history were reviewed and updated as appropriate.   Please see review of systems for further details on the patient's review from today.   Objective:    Physical Exam:  Wt (!) 470 lb (213.2 kg)   Physical Exam Neurological:     Mental Status: He is alert and oriented to person, place, and time.     Cranial Nerves: No dysarthria.  Psychiatric:        Attention and Perception: Attention normal.        Mood and Affect: Mood is depressed.        Speech: Speech normal.        Behavior: Behavior is cooperative.        Thought Content: Thought content normal. Thought content is not paranoid or delusional. Thought content does not include homicidal or suicidal ideation. Thought content does not include homicidal or suicidal plan.        Cognition and Memory: Cognition and memory normal.        Judgment: Judgment normal.     Comments: Involuntary tongue movements  noted on exam     Lab Review:  No results found for: NA, K, CL, CO2, GLUCOSE, BUN, CREATININE, CALCIUM, PROT, ALBUMIN, AST, ALT, ALKPHOS, BILITOT, GFRNONAA, GFRAA  No results found for: WBC, RBC, HGB, HCT, PLT, MCV, MCH, MCHC, RDW, LYMPHSABS, MONOABS, EOSABS, BASOSABS  No results found for: POCLITH, LITHIUM   No results found for: PHENYTOIN, PHENOBARB, VALPROATE, CBMZ   .res Assessment: Plan:   Pt seen for 25 minutes and greater than 50% of session spent counseling pt re: ADD and possible tx options. Discussed potential benefits, risks, and side effects of stimulants with patient to include increased heart rate, palpitations, insomnia, increased anxiety, increased irritability, or decreased appetite.  Instructed patient to contact office if experiencing any significant tolerability issues. Discussed more time is needed to determine response to Lithium and he could continue to experience improvement from recent initiation and increase in Lithium.  Recommend continuing therapy. Patient advised to contact office with any questions, adverse effects, or acute worsening in signs and symptoms.  Bryce Dillon was seen today for other.  Diagnoses and all orders for this visit:  Attention  deficit disorder, unspecified hyperactivity presence -     methylphenidate (CONCERTA) 27 MG PO CR tablet; Take 1 tablet (27 mg total) by mouth every morning.  Insomnia, unspecified type -     traZODone (DESYREL) 50 MG tablet; Take 1 tablet (50 mg total) by mouth at bedtime.     Please see After Visit Summary for patient specific instructions.  Future Appointments  Date Time Provider [place]  06/01/2019 10:30 AM Thayer Headings, PMHNP [facility] None    No orders of the defined types were placed in this encounter.     -------------------------------

## 2019-05-03 ENCOUNTER — Other Ambulatory Visit: Payer: Self-pay | Admitting: Psychiatry

## 2019-05-03 DIAGNOSIS — F331 Major depressive disorder, recurrent, moderate: Secondary | ICD-10-CM

## 2019-05-03 DIAGNOSIS — F411 Generalized anxiety disorder: Secondary | ICD-10-CM

## 2019-05-07 ENCOUNTER — Telehealth: Payer: Self-pay

## 2019-05-07 NOTE — Telephone Encounter (Signed)
Prior authorization submitted and approved for Methylphenidate 27 mg (concerta) effective 44/10/4740-59/56/3875 through [facility]

## 2019-05-16 ENCOUNTER — Other Ambulatory Visit: Payer: Self-pay | Admitting: Psychiatry

## 2019-05-16 DIAGNOSIS — F331 Major depressive disorder, recurrent, moderate: Secondary | ICD-10-CM

## 2019-06-01 ENCOUNTER — Ambulatory Visit (INDEPENDENT_AMBULATORY_CARE_PROVIDER_SITE_OTHER): Payer: No Typology Code available for payment source | Admitting: Psychiatry

## 2019-06-01 ENCOUNTER — Other Ambulatory Visit: Payer: Self-pay

## 2019-06-01 ENCOUNTER — Encounter: Payer: Self-pay | Admitting: Psychiatry

## 2019-06-01 DIAGNOSIS — G4700 Insomnia, unspecified: Secondary | ICD-10-CM

## 2019-06-01 DIAGNOSIS — F32A Depression, unspecified: Secondary | ICD-10-CM

## 2019-06-01 DIAGNOSIS — F329 Major depressive disorder, single episode, unspecified: Secondary | ICD-10-CM

## 2019-06-01 DIAGNOSIS — F411 Generalized anxiety disorder: Secondary | ICD-10-CM

## 2019-06-01 DIAGNOSIS — F988 Other specified behavioral and emotional disorders with onset usually occurring in childhood and adolescence: Secondary | ICD-10-CM | POA: Diagnosis not present

## 2019-06-01 MED ORDER — METHYLPHENIDATE HCL ER (OSM) 27 MG PO TBCR: 27.0000 mg | EXTENDED_RELEASE_TABLET | ORAL | 0 refills | Status: DC

## 2019-06-01 MED ORDER — LITHIUM CARBONATE 300 MG PO CAPS: 300.0000 mg | ORAL_CAPSULE | Freq: Every day | ORAL | 0 refills | Status: DC

## 2019-06-01 MED ORDER — TRAZODONE HCL 50 MG PO TABS: 50.0000 mg | ORAL_TABLET | Freq: Every day | ORAL | 0 refills | Status: DC

## 2019-06-01 MED ORDER — SERTRALINE HCL 100 MG PO TABS: 200.0000 mg | ORAL_TABLET | Freq: Every day | ORAL | 0 refills | Status: DC

## 2019-06-01 NOTE — Progress Notes (Signed)
Bryce Dillon [id number] 23-May-1991 28 y.o.  Virtual Visit via Video Note  I connected with pt @ on 06/01/19 at 10:30 AM EDT by a video enabled telemedicine application and verified that I am speaking with the correct person using two identifiers.   I discussed the limitations of evaluation and management by telemedicine and the availability of in person appointments. The patient expressed understanding and agreed to proceed.  I discussed the assessment and treatment plan with the patient. The patient was provided an opportunity to ask questions and all were answered. The patient agreed with the plan and demonstrated an understanding of the instructions.   The patient was advised to call back or seek an in-person evaluation if the symptoms worsen or if the condition fails to improve as anticipated.  I provided 25 minutes of non-face-to-face time during this encounter.  The patient was located at home.  The provider was located at [place].   Thayer Headings, PMHNP   Subjective:   Patient ID:  Bryce Dillon is a 28 y.o. (DOB 24-Aug-1990) male.  Chief Complaint:  Chief Complaint  Patient presents with  . ADD  . Anxiety  . Depression    HPI Bryce Dillon presents for follow-up of anxiety, depression, insomnia, and ADHD. He reports that he was not able to start Concerta due to pharmacy saying it was not approved. PA approval documented 05/07/19. He reports that he is no longer experiencing suicidal thoughts and mood has been "ok" with minimal depression. He reports that anxiety has been manageable and denies any recent panic attacks. Reports frequent worry and thinks that it has increased. He reports that he has not been worrying about social interaction but has not had many social outings. He reports continued difficulty with concentration. Has recently been able to sit through a TV show for the first time. He reports difficulty with sleep initiation and is not falling asleep until  1-1:30 am and then sleeping until 8 am. Denies nightmares. Appetite has been ok. Motivation has been low. Energy is also somewhat low. Was able to enjoy recent beach trip. Denies SI.   Denies impulsivity or excessive spending other than buying some protein bars.   Has not noticed any recent tongue movements. Denies recent tremors. Continues to experience jaw clinching.   Went to [employer] last week with family.  He reports that he is not enjoying his school as much as he had hoped. He reports that he has had frequent worry about school and what to do and denies excessive worry about other things.   Continues psychotherapy with Monique Crutchfield, LCSW.   Past Psychiatric Medication Trials: Rexulti- Has had tongue movements for several months. Some decrease in movements with addition of Gingko. Unsure of benefits. Abilify Cymbalta Lexapro Buspar- Side effects (HA's, tired) Geodon Modafanil- unsure of benefit. Trazodone Xanax  Review of Systems:  Review of Systems  Musculoskeletal: Negative for gait problem.       Jaw clinching  Neurological: Negative for tremors.  Psychiatric/Behavioral:       Please refer to HPI    Medications: I have reviewed the patient's current medications.  Current Outpatient Medications  Medication Sig Dispense Refill  . lithium carbonate 300 MG capsule Take 1 capsule (300 mg total) by mouth at bedtime. 90 capsule 0  . sertraline (ZOLOFT) 100 MG tablet Take 2 tablets (200 mg total) by mouth daily. 180 tablet 0  . traZODone (DESYREL) 50 MG tablet Take 1 tablet (50 mg total) by mouth at bedtime.  90 tablet 0  . methylphenidate (CONCERTA) 27 MG PO CR tablet Take 1 tablet (27 mg total) by mouth every morning. 30 tablet 0   No current facility-administered medications for this visit.     Medication Side Effects: None  Allergies: No Known Allergies  Past Medical History:  Diagnosis Date  . Anxiety   . Depression     Family History  Problem Relation  Age of Onset  . Alcohol abuse Father     Social History   Socioeconomic History  . Marital status: Single    Spouse name: Not on file  . Number of children: Not on file  . Years of education: Not on file  . Highest education level: Not on file  Occupational History  . Not on file  Social Needs  . Financial resource strain: Not on file  . Food insecurity    Worry: Not on file    Inability: Not on file  . Transportation needs    Medical: Not on file    Non-medical: Not on file  Tobacco Use  . Smoking status: Never Smoker  . Smokeless tobacco: Never Used  Substance and Sexual Activity  . Alcohol use: No  . Drug use: No  . Sexual activity: Never  Lifestyle  . Physical activity    Days per week: Not on file    Minutes per session: Not on file  . Stress: Not on file  Relationships  . Social Musicianconnections    Talks on phone: Not on file    Gets together: Not on file    Attends religious service: Not on file    Active member of club or organization: Not on file    Attends meetings of clubs or organizations: Not on file    Relationship status: Not on file  . Intimate partner violence    Fear of current or ex partner: Not on file    Emotionally abused: Not on file    Physically abused: Not on file    Forced sexual activity: Not on file  Other Topics Concern  . Not on file  Social History Narrative  . Not on file    Past Medical History, Surgical history, Social history, and Family history were reviewed and updated as appropriate.   Please see review of systems for further details on the patient's review from today.   Objective:   Physical Exam:  There were no vitals taken for this visit.  Physical Exam Neurological:     Mental Status: He is alert and oriented to person, place, and time.     Cranial Nerves: No dysarthria.  Psychiatric:        Attention and Perception: Attention normal.        Mood and Affect: Mood is anxious.        Speech: Speech normal.         Behavior: Behavior is cooperative.        Thought Content: Thought content normal. Thought content is not paranoid or delusional. Thought content does not include homicidal or suicidal ideation. Thought content does not include homicidal or suicidal plan.        Cognition and Memory: Cognition and memory normal.        Judgment: Judgment normal.     Comments: Insight intact.  Mood presents as less depressed.  Increased spontaneous interaction.     Lab Review:  No results found for: NA, K, CL, CO2, GLUCOSE, BUN, CREATININE, CALCIUM, PROT, ALBUMIN, AST, ALT, ALKPHOS,  BILITOT, GFRNONAA, GFRAA  No results found for: WBC, RBC, HGB, HCT, PLT, MCV, MCH, MCHC, RDW, LYMPHSABS, MONOABS, EOSABS, BASOSABS  No results found for: POCLITH, LITHIUM   No results found for: PHENYTOIN, PHENOBARB, VALPROATE, CBMZ   .res Assessment: Plan:   Patient seen for 30 minutes and greater than 50% of visit spent counseling patient and coordination of care to include discussing possible reasons for insurance not filling Concerta and coordinating with office nurse.  Discussed that pharmacy has likely not resubmitted Concerta prescription since PA was approved on 05/07/2019.  Will resubmit prescription.  Encouraged patient to tell the pharmacy that PA was approved on October 1 and to try running prescription again.  Encouraged patient to contact office if he continues to have difficulty getting prescription filled. Discussed continuing all other medications as prescribed since patient reports improved mood and resolution of suicidal thoughts with lithium and improved mood and anxiety with sertraline.  Discussed that patient may also experience some improvement in energy and motivation after initiation of Concerta in addition to improved concentration. Recommend continuing psychotherapy. Patient to follow-up with this provider in 4 weeks or sooner if clinically indicated. Patient advised to contact office with any questions,  adverse effects, or acute worsening in signs and symptoms.  Jakob was seen today for add, anxiety and depression.  Diagnoses and all orders for this visit:  Attention deficit disorder, unspecified hyperactivity presence -     methylphenidate (CONCERTA) 27 MG PO CR tablet; Take 1 tablet (27 mg total) by mouth every morning.  Generalized anxiety disorder -     sertraline (ZOLOFT) 100 MG tablet; Take 2 tablets (200 mg total) by mouth daily.  Moderate episode of recurrent major depressive disorder (HCC)  Insomnia, unspecified type -     traZODone (DESYREL) 50 MG tablet; Take 1 tablet (50 mg total) by mouth at bedtime.  Depression, unspecified depression type -     sertraline (ZOLOFT) 100 MG tablet; Take 2 tablets (200 mg total) by mouth daily. -     lithium carbonate 300 MG capsule; Take 1 capsule (300 mg total) by mouth at bedtime.     Please see After Visit Summary for patient specific instructions.  No future appointments.  No orders of the defined types were placed in this encounter.     -------------------------------

## 2019-08-22 ENCOUNTER — Other Ambulatory Visit: Payer: Self-pay | Admitting: Psychiatry

## 2019-08-22 DIAGNOSIS — F32A Depression, unspecified: Secondary | ICD-10-CM

## 2019-08-22 DIAGNOSIS — F411 Generalized anxiety disorder: Secondary | ICD-10-CM

## 2019-08-22 DIAGNOSIS — F329 Major depressive disorder, single episode, unspecified: Secondary | ICD-10-CM

## 2019-08-24 NOTE — Telephone Encounter (Signed)
Last apt 10/26, was due for 4 week f/u

## 2019-09-14 ENCOUNTER — Ambulatory Visit (INDEPENDENT_AMBULATORY_CARE_PROVIDER_SITE_OTHER): Payer: No Typology Code available for payment source | Admitting: Psychiatry

## 2019-09-14 ENCOUNTER — Encounter: Payer: Self-pay | Admitting: Psychiatry

## 2019-09-14 DIAGNOSIS — F411 Generalized anxiety disorder: Secondary | ICD-10-CM | POA: Diagnosis not present

## 2019-09-14 DIAGNOSIS — F331 Major depressive disorder, recurrent, moderate: Secondary | ICD-10-CM

## 2019-09-14 DIAGNOSIS — G4700 Insomnia, unspecified: Secondary | ICD-10-CM | POA: Diagnosis not present

## 2019-09-14 DIAGNOSIS — F988 Other specified behavioral and emotional disorders with onset usually occurring in childhood and adolescence: Secondary | ICD-10-CM

## 2019-09-14 MED ORDER — TRAZODONE HCL 50 MG PO TABS: 50.0000 mg | ORAL_TABLET | Freq: Every day | ORAL | 0 refills | Status: DC

## 2019-09-14 MED ORDER — METHYLPHENIDATE HCL ER (OSM) 36 MG PO TBCR: 36.0000 mg | EXTENDED_RELEASE_TABLET | ORAL | 0 refills | Status: DC

## 2019-09-14 NOTE — Progress Notes (Signed)
Bryce Dillon [id number] [birth year] 29 y.o.  Virtual Visit via Video Note  I connected with pt @ on 09/14/19 at  9:00 AM EST by a video enabled telemedicine application and verified that I am speaking with the correct person using two identifiers.   I discussed the limitations of evaluation and management by telemedicine and the availability of in person appointments. The patient expressed understanding and agreed to proceed.  I discussed the assessment and treatment plan with the patient. The patient was provided an opportunity to ask questions and all were answered. The patient agreed with the plan and demonstrated an understanding of the instructions.   The patient was advised to call back or seek an in-person evaluation if the symptoms worsen or if the condition fails to improve as anticipated.  I provided 30 minutes of non-face-to-face time during this encounter.  The patient was located at home.  The provider was located at [facility].   Corie Chiquito, PMHNP   Subjective:   Patient ID:  Bryce Dillon is a 29 y.o. (DOB 1990-08-23) male.  Chief Complaint:  Chief Complaint  Patient presents with  . Depression  . ADD  . Anxiety  . Insomnia    HPI Bryce Dillon presents for follow-up of depression, anxiety, and ADD. He reports that he feels medication has been helpful. He was able to get Concerta filled and has noticed some improvement in focus but continues to have some difficulty with focus and reports that response lasts a few hours. Denies side effects. Mood has been "ok" with some worsening in the last 2 weeks with more depression and feelings of hopelessness. Irritability has been increased as well. He reports that he has not been communicating as well recently with either family or coworkers. Has been out of Trazodone for the last 5 days. Sleep was not as good and significantly worsened when Trazodone ran out. Has been sleeping later since then. He reports that anxiety  has been "ok." He reports that he has had some anticipatory anxiety on Sunday night about work. He reports that he has had some anxiety with interpersonal interactions with coworkers. He has anxiety about potentially having to interact with other people for his work and possible exposure risk for COVID. He denies any recent panic s/s. He had some SOB with anxiety last night. Appetite and food intake has been increased. Energy and motivation have been low. "I really don't want to do much." Reports that he is working, sleeping, and eating with little other activity. Reports diminished interest and enjoyment in things. He reports difficulty watching TV and reading because of difficulty sustaining concentration.   Denies SI.   Denies any involuntary movements.   Has continued to see Keane Scrape, LCSW for therapy.   Past Psychiatric Medication Trials: Rexulti- Has had tongue movements for several months. Some decrease in movements with addition of Gingko. Unsure of benefits. Abilify Cymbalta Lexapro Buspar- Side effects (HA's, tired) Geodon Modafanil- unsure of benefit. Trazodone Xanax   Review of Systems:  Review of Systems  Cardiovascular: Negative for palpitations.  Musculoskeletal: Negative for gait problem.  Skin: Positive for rash.       He reports "bumps" on arms and legs that itch and may be fluid filled. He reports that he has been having itching on bottoms of his feet. He reports that bumps appeared 1-2 months ago and are sore.   Neurological: Negative for tremors.  Psychiatric/Behavioral:       Please refer to HPI  Medications: I have reviewed the patient's current medications.  Current Outpatient Medications  Medication Sig Dispense Refill  . lithium carbonate 300 MG capsule TAKE 1 CAPSULE BY MOUTH EVERYDAY AT BEDTIME 90 capsule 0  . Melatonin 5 MG CAPS Take by mouth.    . sertraline (ZOLOFT) 100 MG tablet TAKE 2 TABLETS BY MOUTH EVERY DAY 180 tablet 0  .  methylphenidate (CONCERTA) 36 MG PO CR tablet Take 1 tablet (36 mg total) by mouth every morning. 30 tablet 0  . traZODone (DESYREL) 50 MG tablet Take 1 tablet (50 mg total) by mouth at bedtime. 90 tablet 0   No current facility-administered medications for this visit.    Medication Side Effects: None  Allergies: No Known Allergies  Past Medical History:  Diagnosis Date  . Anxiety   . Depression     Family History  Problem Relation Age of Onset  . Alcohol abuse Father     Social History   Socioeconomic History  . Marital status: Single    Spouse name: Not on file  . Number of children: Not on file  . Years of education: Not on file  . Highest education level: Not on file  Occupational History  . Not on file  Tobacco Use  . Smoking status: Never Smoker  . Smokeless tobacco: Never Used  Substance and Sexual Activity  . Alcohol use: No  . Drug use: No  . Sexual activity: Never  Other Topics Concern  . Not on file  Social History Narrative  . Not on file   Social Determinants of Health   Financial Resource Strain:   . Difficulty of Paying Living Expenses: Not on file  Food Insecurity:   . Worried About Programme researcher, broadcasting/film/video in the Last Year: Not on file  . Ran Out of Food in the Last Year: Not on file  Transportation Needs:   . Lack of Transportation (Medical): Not on file  . Lack of Transportation (Non-Medical): Not on file  Physical Activity:   . Days of Exercise per Week: Not on file  . Minutes of Exercise per Session: Not on file  Stress:   . Feeling of Stress : Not on file  Social Connections:   . Frequency of Communication with Friends and Family: Not on file  . Frequency of Social Gatherings with Friends and Family: Not on file  . Attends Religious Services: Not on file  . Active Member of Clubs or Organizations: Not on file  . Attends Banker Meetings: Not on file  . Marital Status: Not on file  Intimate Partner Violence:   . Fear of  Current or Ex-Partner: Not on file  . Emotionally Abused: Not on file  . Physically Abused: Not on file  . Sexually Abused: Not on file    Past Medical History, Surgical history, Social history, and Family history were reviewed and updated as appropriate.   Please see review of systems for further details on the patient's review from today.   Objective:   Physical Exam:  There were no vitals taken for this visit.  Physical Exam Neurological:     Mental Status: He is alert and oriented to person, place, and time.     Cranial Nerves: No dysarthria.  Psychiatric:        Attention and Perception: Attention and perception normal.        Mood and Affect: Mood is anxious and depressed.        Speech: Speech normal.  Behavior: Behavior is cooperative.        Thought Content: Thought content normal. Thought content is not paranoid or delusional. Thought content does not include homicidal or suicidal ideation. Thought content does not include homicidal or suicidal plan.        Cognition and Memory: Cognition and memory normal.        Judgment: Judgment normal.     Comments: Insight intact     Lab Review:  No results found for: NA, K, CL, CO2, GLUCOSE, BUN, CREATININE, CALCIUM, PROT, ALBUMIN, AST, ALT, ALKPHOS, BILITOT, GFRNONAA, GFRAA  No results found for: WBC, RBC, HGB, HCT, PLT, MCV, MCH, MCHC, RDW, LYMPHSABS, MONOABS, EOSABS, BASOSABS  No results found for: POCLITH, LITHIUM   No results found for: PHENYTOIN, PHENOBARB, VALPROATE, CBMZ   .res Assessment: Plan:   Pt seen for 30 minutes and discussed possible tx options. Discussed increase in Concerta to 36 mg may be helpful to extend duration of response, however anxiety and irritability could potentially be worse at higher dose. Pt reports that he would prefer to try higher dose since impaired concentration and not being able to be productive contributes significantly to his depression.  Will increase Concerta to 36 mg po q  am for ADD. Advised pt to contact office if he is unable to fill Concerta due to insurance reasons.  Continue Lithium 300 mg po QHS for depression and suicidal thoughts. Continue Sertraline 200 mg po qd for anxiety and depression.  Continue Trazodone 50 mg po QHS for insomnia.  Recommend continuing to see therapist.  Pt to f/u in 4 weeks or sooner if clinically indicated.  Patient advised to contact office with any questions, adverse effects, or acute worsening in signs and symptoms.  Bryce Dillon was seen today for depression, add, anxiety and insomnia.  Diagnoses and all orders for this visit:  Moderate episode of recurrent major depressive disorder (HCC)  Insomnia, unspecified type -     traZODone (DESYREL) 50 MG tablet; Take 1 tablet (50 mg total) by mouth at bedtime.  Attention deficit disorder, unspecified hyperactivity presence -     methylphenidate (CONCERTA) 36 MG PO CR tablet; Take 1 tablet (36 mg total) by mouth every morning.  Generalized anxiety disorder     Please see After Visit Summary for patient specific instructions.  No future appointments.  No orders of the defined types were placed in this encounter.     -------------------------------

## 2019-11-04 ENCOUNTER — Other Ambulatory Visit: Payer: Self-pay

## 2019-11-04 ENCOUNTER — Telehealth: Payer: Self-pay | Admitting: Psychiatry

## 2019-11-04 DIAGNOSIS — F988 Other specified behavioral and emotional disorders with onset usually occurring in childhood and adolescence: Secondary | ICD-10-CM

## 2019-11-04 DIAGNOSIS — F411 Generalized anxiety disorder: Secondary | ICD-10-CM

## 2019-11-04 DIAGNOSIS — G4700 Insomnia, unspecified: Secondary | ICD-10-CM

## 2019-11-04 DIAGNOSIS — F329 Major depressive disorder, single episode, unspecified: Secondary | ICD-10-CM

## 2019-11-04 DIAGNOSIS — F32A Depression, unspecified: Secondary | ICD-10-CM

## 2019-11-04 MED ORDER — TRAZODONE HCL 50 MG PO TABS: 50.0000 mg | ORAL_TABLET | Freq: Every day | ORAL | 0 refills | Status: DC

## 2019-11-04 MED ORDER — SERTRALINE HCL 100 MG PO TABS: 200.0000 mg | ORAL_TABLET | Freq: Every day | ORAL | 0 refills | Status: DC

## 2019-11-04 NOTE — Telephone Encounter (Signed)
Pt mom Britta Mccreedy requesting meds to be filled at [facility] [place].Marland Kitchen

## 2019-11-12 ENCOUNTER — Other Ambulatory Visit: Payer: Self-pay

## 2019-11-12 ENCOUNTER — Telehealth: Payer: Self-pay | Admitting: Psychiatry

## 2019-11-12 NOTE — Telephone Encounter (Signed)
Pt made appt for 4/20. Pt would like a refill on Concerta 36mg  . They could see a big difference in the increase to 36mg .Please call in at [facility] in [place].

## 2019-11-12 NOTE — Telephone Encounter (Signed)
Rx pended for Jessica to submit 

## 2019-11-13 MED ORDER — METHYLPHENIDATE HCL ER (OSM) 36 MG PO TBCR: 36.0000 mg | EXTENDED_RELEASE_TABLET | ORAL | 0 refills | Status: DC

## 2019-11-24 ENCOUNTER — Ambulatory Visit: Payer: No Typology Code available for payment source | Admitting: Psychiatry

## 2019-11-25 ENCOUNTER — Other Ambulatory Visit: Payer: Self-pay | Admitting: Psychiatry

## 2019-11-25 DIAGNOSIS — F411 Generalized anxiety disorder: Secondary | ICD-10-CM

## 2019-11-25 DIAGNOSIS — F329 Major depressive disorder, single episode, unspecified: Secondary | ICD-10-CM

## 2019-11-25 DIAGNOSIS — F32A Depression, unspecified: Secondary | ICD-10-CM

## 2019-11-26 ENCOUNTER — Ambulatory Visit (INDEPENDENT_AMBULATORY_CARE_PROVIDER_SITE_OTHER): Payer: No Typology Code available for payment source | Admitting: Psychiatry

## 2019-11-26 ENCOUNTER — Encounter: Payer: Self-pay | Admitting: Psychiatry

## 2019-11-26 DIAGNOSIS — F329 Major depressive disorder, single episode, unspecified: Secondary | ICD-10-CM | POA: Diagnosis not present

## 2019-11-26 DIAGNOSIS — F32A Depression, unspecified: Secondary | ICD-10-CM

## 2019-11-26 MED ORDER — LITHIUM CARBONATE 150 MG PO CAPS: 450.0000 mg | ORAL_CAPSULE | Freq: Every day | ORAL | 1 refills | Status: DC

## 2019-11-26 NOTE — Progress Notes (Signed)
Bryce Dillon [id number] 09-07-1990 29 y.o.  Virtual Visit via Video Note  I connected with pt @ on 11/27/19 at 10:00 AM EDT by a video enabled telemedicine application and verified that I am speaking with the correct person using two identifiers.   I discussed the limitations of evaluation and management by telemedicine and the availability of in person appointments. The patient expressed understanding and agreed to proceed.  I discussed the assessment and treatment plan with the patient. The patient was provided an opportunity to ask questions and all were answered. The patient agreed with the plan and demonstrated an understanding of the instructions.   The patient was advised to call back or seek an in-person evaluation if the symptoms worsen or if the condition fails to improve as anticipated.  I provided 30 minutes of non-face-to-face time during this encounter.  The patient was located at home.  The provider was located at [place].   Thayer Headings, PMHNP   Subjective:   Patient ID:  Bryce Dillon is a 29 y.o. (DOB 1990/10/05) male.  Chief Complaint:  Chief Complaint  Patient presents with  . Depression  . Anxiety    HPI Bryce Dillon presents for follow-up of depression, anxiety, and insomnia. He reports that Concerta he has not noticed a significant change in concentration. He reports that concentration has been ok.  He notices a HA immediately after taking Concerta and continues for 1-1.5 hours.   He reports that he has lost interest in things and motivation has been very low. He reports more depression and sadness, and is unsure of trigger. Energy has been fair. He reports that he goes to his desk and works until 4-4:30. He reports that he has been able to do what is needed for work. He reports that he is having difficulty watching a TV show or reading a book. Some anhedonia. He reports that his anxiety has been "ok" and has not had recent significant anxiety.  Denies panic. He reports that he has been sleeping ok. No change in appetite. Has had some fleeting passive death wishes. Denies SI.   Reports feeling more restless when he lays down. Some leg movements when he lays down. Denies tremor during the day. Will grit teeth at times and notices some orofacial movements.   Past Psychiatric Medication Trials: Rexulti- Has had tongue movements for several months. Some decrease in movements with addition of Gingko. Unsure of benefits. Abilify Cymbalta Lexapro Sertraline- Helpful for anxiety Wellbutrin-racing thoughts Buspar- Side effects (HA's, tired) Geodon Modafanil- unsure of benefit. Trazodone Xanax  Review of Systems:  Review of Systems  Musculoskeletal: Negative for gait problem.  Neurological: Positive for headaches. Negative for tremors.  Psychiatric/Behavioral:       Please refer to HPI    Medications: I have reviewed the patient's current medications.  Current Outpatient Medications  Medication Sig Dispense Refill  . lithium carbonate 150 MG capsule Take 3 capsules (450 mg total) by mouth at bedtime. 90 capsule 1  . Melatonin 5 MG CAPS Take by mouth.    . traZODone (DESYREL) 50 MG tablet Take 1 tablet (50 mg total) by mouth at bedtime. 90 tablet 0  . sertraline (ZOLOFT) 100 MG tablet TAKE 2 TABLETS BY MOUTH EVERY DAY 180 tablet 0   No current facility-administered medications for this visit.    Medication Side Effects: Headache  Allergies: No Known Allergies  Past Medical History:  Diagnosis Date  . Anxiety   . Depression     Family  History  Problem Relation Age of Onset  . Alcohol abuse Father     Social History   Socioeconomic History  . Marital status: Single    Spouse name: Not on file  . Number of children: Not on file  . Years of education: Not on file  . Highest education level: Not on file  Occupational History  . Not on file  Tobacco Use  . Smoking status: Never Smoker  . Smokeless tobacco:  Never Used  Substance and Sexual Activity  . Alcohol use: No  . Drug use: No  . Sexual activity: Never  Other Topics Concern  . Not on file  Social History Narrative  . Not on file   Social Determinants of Health   Financial Resource Strain:   . Difficulty of Paying Living Expenses:   Food Insecurity:   . Worried About Programme researcher, broadcasting/film/video in the Last Year:   . Barista in the Last Year:   Transportation Needs:   . Freight forwarder (Medical):   Marland Kitchen Lack of Transportation (Non-Medical):   Physical Activity:   . Days of Exercise per Week:   . Minutes of Exercise per Session:   Stress:   . Feeling of Stress :   Social Connections:   . Frequency of Communication with Friends and Family:   . Frequency of Social Gatherings with Friends and Family:   . Attends Religious Services:   . Active Member of Clubs or Organizations:   . Attends Banker Meetings:   Marland Kitchen Marital Status:   Intimate Partner Violence:   . Fear of Current or Ex-Partner:   . Emotionally Abused:   Marland Kitchen Physically Abused:   . Sexually Abused:     Past Medical History, Surgical history, Social history, and Family history were reviewed and updated as appropriate.   Please see review of systems for further details on the patient's review from today.   Objective:   Physical Exam:  There were no vitals taken for this visit.  Physical Exam Neurological:     Mental Status: He is alert and oriented to person, place, and time.     Cranial Nerves: No dysarthria.  Psychiatric:        Attention and Perception: Attention and perception normal.        Mood and Affect: Mood is anxious and depressed.        Speech: Speech normal.        Behavior: Behavior is cooperative.        Thought Content: Thought content normal. Thought content is not paranoid or delusional. Thought content does not include homicidal or suicidal ideation. Thought content does not include homicidal or suicidal plan.         Cognition and Memory: Cognition and memory normal.        Judgment: Judgment normal.     Comments: Insight intact     Lab Review:  No results found for: NA, K, CL, CO2, GLUCOSE, BUN, CREATININE, CALCIUM, PROT, ALBUMIN, AST, ALT, ALKPHOS, BILITOT, GFRNONAA, GFRAA  No results found for: WBC, RBC, HGB, HCT, PLT, MCV, MCH, MCHC, RDW, LYMPHSABS, MONOABS, EOSABS, BASOSABS  No results found for: POCLITH, LITHIUM   No results found for: PHENYTOIN, PHENOBARB, VALPROATE, CBMZ   .res Assessment: Plan:   Patient seen for 30 minutes and time spent counseling patient regarding possible treatment options for depression since patient currently identifies depression as his chief complaint.  Discussed potential benefits, risks, and side effects  of increasing lithium to improve depressed mood.  Patient agrees to increase lithium to 450 mg at bedtime. Discontinue Concerta due to minimal improvement and worsening anxiety. Continue sertraline 200 mg daily for anxiety and depression. Continue trazodone 50 mg at bedtime for insomnia. Recommend continuing psychotherapy with Monique Crutchfield, LCSW. Patient to follow-up with this provider in 4 weeks or sooner if clinically indicated. Patient advised to contact office with any questions, adverse effects, or acute worsening in signs and symptoms.  Bryce Dillon was seen today for depression and anxiety.  Diagnoses and all orders for this visit:  Depression, unspecified depression type -     lithium carbonate 150 MG capsule; Take 3 capsules (450 mg total) by mouth at bedtime.     Please see After Visit Summary for patient specific instructions.  No future appointments.  No orders of the defined types were placed in this encounter.     -------------------------------

## 2019-11-26 NOTE — Telephone Encounter (Signed)
Apt today at 10

## 2019-11-28 ENCOUNTER — Other Ambulatory Visit: Payer: Self-pay | Admitting: Psychiatry

## 2019-11-28 DIAGNOSIS — F329 Major depressive disorder, single episode, unspecified: Secondary | ICD-10-CM

## 2019-11-28 DIAGNOSIS — F32A Depression, unspecified: Secondary | ICD-10-CM

## 2019-12-23 ENCOUNTER — Other Ambulatory Visit: Payer: Self-pay | Admitting: Psychiatry

## 2019-12-23 DIAGNOSIS — F32A Depression, unspecified: Secondary | ICD-10-CM

## 2019-12-30 ENCOUNTER — Ambulatory Visit: Payer: No Typology Code available for payment source | Admitting: Psychiatry

## 2020-01-27 ENCOUNTER — Telehealth: Payer: Self-pay | Admitting: Psychiatry

## 2020-01-27 ENCOUNTER — Telehealth (INDEPENDENT_AMBULATORY_CARE_PROVIDER_SITE_OTHER): Payer: No Typology Code available for payment source | Admitting: Psychiatry

## 2020-01-27 ENCOUNTER — Encounter: Payer: Self-pay | Admitting: Psychiatry

## 2020-01-27 DIAGNOSIS — G2401 Drug induced subacute dyskinesia: Secondary | ICD-10-CM

## 2020-01-27 DIAGNOSIS — F329 Major depressive disorder, single episode, unspecified: Secondary | ICD-10-CM

## 2020-01-27 DIAGNOSIS — F411 Generalized anxiety disorder: Secondary | ICD-10-CM

## 2020-01-27 DIAGNOSIS — G4700 Insomnia, unspecified: Secondary | ICD-10-CM

## 2020-01-27 MED ORDER — AUSTEDO 9 MG PO TABS: 9.0000 mg | ORAL_TABLET | Freq: Two times a day (BID) | ORAL | 0 refills | Status: DC

## 2020-01-27 MED ORDER — TRAZODONE HCL 50 MG PO TABS: 50.0000 mg | ORAL_TABLET | Freq: Every day | ORAL | 0 refills | Status: DC

## 2020-01-27 MED ORDER — SERTRALINE HCL 100 MG PO TABS: 200.0000 mg | ORAL_TABLET | Freq: Every day | ORAL | 0 refills | Status: DC

## 2020-01-27 NOTE — Progress Notes (Signed)
Bryce Dillon [id number] 10-Mar-1991 29 y.o.  Virtual Visit via Video Note  I connected with pt @ on 01/27/20 at 11:00 AM EDT by a video enabled telemedicine application and verified that I am speaking with the correct person using two identifiers.   I discussed the limitations of evaluation and management by telemedicine and the availability of in person appointments. The patient expressed understanding and agreed to proceed.  I discussed the assessment and treatment plan with the patient. The patient was provided an opportunity to ask questions and all were answered. The patient agreed with the plan and demonstrated an understanding of the instructions.   The patient was advised to call back or seek an in-person evaluation if the symptoms worsen or if the condition fails to improve as anticipated.  I provided 30 minutes of non-face-to-face time during this encounter.  The patient was located at home.  The provider was located at [facility].   Corie Chiquito, PMHNP   Subjective:   Patient ID:  Bryce Dillon is a 29 y.o. (DOB 03-13-1991) male.  Chief Complaint:  Chief Complaint  Patient presents with  . Follow-up    TD, anxiety, depression    HPI Bryce Dillon presents for follow-up of depression, anxiety, insomnia, and TD. He reports that he continues to experience TD with clinching and having to move around at night. He reports that TD is interfering with sleep and has movements in his legs and hips. He reports that TD seems to be worsening.   He reports that his mood has been stable. He reports that he has some anxiety with getting out of the house and going places. He reports that social anxiety has been about the same and he is trying to go out more. 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 "I do worry, but not as much." Denies panic. He reports adequate sleep. Appetite has been steady. Has had less  desire to eat take out. Energy and motivation have been low. He reports that he is working with therapist to set goals. Concentration and focus have been fair. Denies SI.   Past Psychiatric Medication Trials: Rexulti- Has had tongue movements for several months. Some decrease in movements with addition of Gingko. Unsure of benefits. Abilify Cymbalta Lexapro Sertraline- Helpful for anxiety Wellbutrin-racing thoughts Buspar- Side effects (HA's, tired) Geodon Modafanil- unsure of benefit. Trazodone Xanax  Review of Systems:  Review of Systems  Respiratory: Positive for shortness of breath.   Cardiovascular: Negative for palpitations.  Musculoskeletal: Negative for gait problem.  Neurological: Negative for tremors and headaches.  Psychiatric/Behavioral:       Please refer to HPI    Medications: I have reviewed the patient's current medications.  Current Outpatient Medications  Medication Sig Dispense Refill  . diphenhydrAMINE (BENADRYL) 25 MG tablet Take 25 mg by mouth at bedtime as needed for sleep.    Marland Kitchen lithium carbonate 150 MG capsule TAKE 3 CAPSULES (450 MG TOTAL) BY MOUTH AT BEDTIME. 270 capsule 0  . Melatonin 5 MG CAPS Take by mouth.    . sertraline (ZOLOFT) 100 MG tablet Take 2 tablets (200 mg total) by mouth daily. 180 tablet 0  . traZODone (DESYREL) 50 MG tablet Take 1 tablet (50 mg total) by mouth at bedtime. 90 tablet 0  . Deutetrabenazine (AUSTEDO) 9 MG TABS Take 9 mg by mouth in the morning and at bedtime. 60 tablet 0  No current facility-administered medications for this visit.    Medication Side Effects: None  Allergies: No Known Allergies  Past Medical History:  Diagnosis Date  . Anxiety   . Depression     Family History  Problem Relation Age of Onset  . Alcohol abuse Father     Social History   Socioeconomic History  . Marital status: Single    Spouse name: Not on file  . Number of children: Not on file  . Years of education: Not on file  .  Highest education level: Not on file  Occupational History  . Not on file  Tobacco Use  . Smoking status: Never Smoker  . Smokeless tobacco: Never Used  Substance and Sexual Activity  . Alcohol use: No  . Drug use: No  . Sexual activity: Never  Other Topics Concern  . Not on file  Social History Narrative  . Not on file   Social Determinants of Health   Financial Resource Strain:   . Difficulty of Paying Living Expenses:   Food Insecurity:   . Worried About Charity fundraiser in the Last Year:   . Arboriculturist in the Last Year:   Transportation Needs:   . Film/video editor (Medical):   Marland Kitchen Lack of Transportation (Non-Medical):   Physical Activity:   . Days of Exercise per Week:   . Minutes of Exercise per Session:   Stress:   . Feeling of Stress :   Social Connections:   . Frequency of Communication with Friends and Family:   . Frequency of Social Gatherings with Friends and Family:   . Attends Religious Services:   . Active Member of Clubs or Organizations:   . Attends Archivist Meetings:   Marland Kitchen Marital Status:   Intimate Partner Violence:   . Fear of Current or Ex-Partner:   . Emotionally Abused:   Marland Kitchen Physically Abused:   . Sexually Abused:     Past Medical History, Surgical history, Social history, and Family history were reviewed and updated as appropriate.   Please see review of systems for further details on the patient's review from today.   Objective:   Physical Exam:  There were no vitals taken for this visit.  Physical Exam Neurological:     Mental Status: He is alert and oriented to person, place, and time.     Cranial Nerves: No dysarthria.  Psychiatric:        Attention and Perception: Attention and perception normal.        Mood and Affect: Mood normal.        Speech: Speech normal.        Behavior: Behavior is cooperative.        Thought Content: Thought content normal. Thought content is not paranoid or delusional. Thought  content does not include homicidal or suicidal ideation. Thought content does not include homicidal or suicidal plan.        Cognition and Memory: Cognition and memory normal.        Judgment: Judgment normal.     Comments: Insight intact Mood presents as less anxious and less depressed     Lab Review:  No results found for: NA, K, CL, CO2, GLUCOSE, BUN, CREATININE, CALCIUM, PROT, ALBUMIN, AST, ALT, ALKPHOS, BILITOT, GFRNONAA, GFRAA  No results found for: WBC, RBC, HGB, HCT, PLT, MCV, MCH, MCHC, RDW, LYMPHSABS, MONOABS, EOSABS, BASOSABS  No results found for: POCLITH, LITHIUM   No results found for: PHENYTOIN,  PHENOBARB, VALPROATE, CBMZ   .res Assessment: Plan:   Patient seen for 30 minutes and time spent counseling patient regarding possible treatment options for tardive dyskinesia to include discussing potential benefits, risks, and side effects of Austedo and Ingrezza.  Patient agrees to trial of Austedo.  Will pull samples for patient to come and pick up and will also send prescription to [facility].  Will continue sertraline 200 mg daily for anxiety depression. Continue trazodone for insomnia. Patient to follow-up in 3 months or sooner if clinically indicated. Patient advised to contact office with any questions, adverse effects, or acute worsening in signs and symptoms.  Anh was seen today for follow-up.  Diagnoses and all orders for this visit:  Tardive dyskinesia -     Discontinue: Deutetrabenazine (AUSTEDO) 9 MG TABS; Take 9 mg by mouth in the morning and at bedtime. -     Deutetrabenazine (AUSTEDO) 9 MG TABS; Take 9 mg by mouth in the morning and at bedtime.  Depression, unspecified depression type -     sertraline (ZOLOFT) 100 MG tablet; Take 2 tablets (200 mg total) by mouth daily.  Generalized anxiety disorder -     sertraline (ZOLOFT) 100 MG tablet; Take 2 tablets (200 mg total) by mouth daily.  Insomnia, unspecified type -     traZODone (DESYREL) 50 MG  tablet; Take 1 tablet (50 mg total) by mouth at bedtime.     Please see After Visit Summary for patient specific instructions.  No future appointments.  No orders of the defined types were placed in this encounter.     -------------------------------

## 2020-01-27 NOTE — Telephone Encounter (Signed)
Mr. darcy, cordner are scheduled for a virtual visit with your provider today.    Just as we do with appointments in the office, we must obtain your consent to participate.  Your consent will be active for this visit and any virtual visit you may have with one of our providers in the next 365 days.    If you have a MyChart account, I can also send a copy of this consent to you electronically.  All virtual visits are billed to your insurance company just like a traditional visit in the office.  As this is a virtual visit, video technology does not allow for your provider to perform a traditional examination.  This may limit your provider's ability to fully assess your condition.  If your provider identifies any concerns that need to be evaluated in person or the need to arrange testing such as labs, EKG, etc, we will make arrangements to do so.    Although advances in technology are sophisticated, we cannot ensure that it will always work on either your end or our end.  If the connection with a video visit is poor, we may have to switch to a telephone visit.  With either a video or telephone visit, we are not always able to ensure that we have a secure connection.   I need to obtain your verbal consent now.   Are you willing to proceed with your visit today?   Jaylun Fleener has provided verbal consent on 01/27/2020 for a virtual visit (video or telephone).   Corie Chiquito, PMHNP 01/27/2020  11:21 AM

## 2020-01-28 ENCOUNTER — Telehealth: Payer: Self-pay

## 2020-01-28 DIAGNOSIS — G2401 Drug induced subacute dyskinesia: Secondary | ICD-10-CM

## 2020-01-28 NOTE — Telephone Encounter (Signed)
Prior authorization initiated with [facility] for AUSTEDO 9 MG #60 approved through [facility] ID# [id number], effective 01/28/2020-04/29/2020 for 3 months then patient will need to be reassessed for improvement with symptoms. PA# [id number]  [facility] to contact patient about cost and delivery of medication.

## 2020-01-29 ENCOUNTER — Telehealth: Payer: Self-pay | Admitting: Psychiatry

## 2020-01-29 DIAGNOSIS — G2401 Drug induced subacute dyskinesia: Secondary | ICD-10-CM

## 2020-01-29 MED ORDER — AUSTEDO 9 MG PO TABS: 18.0000 mg | ORAL_TABLET | Freq: Two times a day (BID) | ORAL | 1 refills | Status: DC

## 2020-01-29 NOTE — Telephone Encounter (Signed)
Will send in script for maintenance dose for Austedo

## 2020-03-21 ENCOUNTER — Telehealth (INDEPENDENT_AMBULATORY_CARE_PROVIDER_SITE_OTHER): Payer: No Typology Code available for payment source | Admitting: Psychiatry

## 2020-03-21 ENCOUNTER — Encounter: Payer: Self-pay | Admitting: Psychiatry

## 2020-03-21 DIAGNOSIS — F329 Major depressive disorder, single episode, unspecified: Secondary | ICD-10-CM | POA: Diagnosis not present

## 2020-03-21 DIAGNOSIS — G2401 Drug induced subacute dyskinesia: Secondary | ICD-10-CM

## 2020-03-21 DIAGNOSIS — G4700 Insomnia, unspecified: Secondary | ICD-10-CM

## 2020-03-21 DIAGNOSIS — F411 Generalized anxiety disorder: Secondary | ICD-10-CM

## 2020-03-21 DIAGNOSIS — F32A Depression, unspecified: Secondary | ICD-10-CM

## 2020-03-21 MED ORDER — LAMOTRIGINE 25 MG PO TABS: ORAL_TABLET | ORAL | 0 refills | Status: DC

## 2020-03-21 MED ORDER — TRAZODONE HCL 50 MG PO TABS: 50.0000 mg | ORAL_TABLET | Freq: Every day | ORAL | 0 refills | Status: DC

## 2020-03-21 MED ORDER — LITHIUM CARBONATE 150 MG PO CAPS: 450.0000 mg | ORAL_CAPSULE | Freq: Every day | ORAL | 0 refills | Status: DC

## 2020-03-21 MED ORDER — SERTRALINE HCL 100 MG PO TABS: 200.0000 mg | ORAL_TABLET | Freq: Every day | ORAL | 0 refills | Status: DC

## 2020-03-21 MED ORDER — INGREZZA 80 MG PO CAPS: 80.0000 mg | ORAL_CAPSULE | Freq: Every day | ORAL | 1 refills | Status: DC

## 2020-03-21 MED ORDER — INGREZZA 40 & 80 MG PO CPPK: ORAL_CAPSULE | ORAL | 0 refills | Status: DC

## 2020-03-21 NOTE — Progress Notes (Signed)
Bryce Dillon [id number] July 04, 1991 29 y.o.  Virtual Visit via Video Note  I connected with pt @ on 03/21/20 at  1:00 PM EDT by a video enabled telemedicine application and verified that I am speaking with the correct person using two identifiers.   I discussed the limitations of evaluation and management by telemedicine and the availability of in person appointments. The patient expressed understanding and agreed to proceed.  I discussed the assessment and treatment plan with the patient. The patient was provided an opportunity to ask questions and all were answered. The patient agreed with the plan and demonstrated an understanding of the instructions.   The patient was advised to call back or seek an in-person evaluation if the symptoms worsen or if the condition fails to improve as anticipated.  I provided 30 minutes of non-face-to-face time during this encounter.  The patient was located at home.  The provider was located at [facility].   Corie Chiquito, PMHNP   Subjective:   Patient ID:  Bryce Dillon is a 29 y.o. (DOB September 10, 1990) male.  Chief Complaint:  Chief Complaint  Patient presents with   Depression   Other    Tardive dyskinesia   Medication Problem    Insomnia with Austedo    HPI Mikaeel Petrow presents for follow-up of depression, anxiety, insomnia, tardive dyskinesia.  Christina reports that he has had some insomnia with Austedo since about day 6. He reports that he then stopped Austedo and sleep improved. He reports that he continues to have significant tardive dyskinesia.   Went out Friday night for the first time in awhile and reports that his anxiety was ok. He reports that anxiety has been ok overall. Mood is down. Rates depression an 8 out of 10 with 10= most depressed mood imaginable. Some irritability. Energy has been low. Motivation has been very low. Over-eating some days and at times not eating much. Concentration has been about the same. He reports  that he has had some thoughts like, "I'm not going to be here for awhile." Denies SI.   He reports that he has been doing the "bare minimum" at work. Will be working remotely most of the time.   Continues therapy with Monique Crutchfield, LCSW.   Past Psychiatric Medication Trials: Rexulti- Has had tongue movements for several months. Some decrease in movements with addition of Gingko. Unsure of benefits. Abilify Cymbalta Lexapro Sertraline- Helpful for anxiety Wellbutrin-racing thoughts Buspar- Side effects (HA's, tired) Lithium Geodon Modafanil- unsure of benefit. Trazodone Xanax   Review of Systems:  Review of Systems  Constitutional: Negative for fever.  Respiratory: Positive for shortness of breath.   Cardiovascular:       Chest tightness  Musculoskeletal: Negative for gait problem.       Has had leg pain since going out Friday  evening  Neurological: Positive for tremors.  Psychiatric/Behavioral:       Please refer to HPI    Medications: I have reviewed the patient's current medications.  Current Outpatient Medications  Medication Sig Dispense Refill   diphenhydrAMINE (BENADRYL) 25 MG tablet Take 25 mg by mouth at bedtime as needed for sleep.     lithium carbonate 150 MG capsule Take 3 capsules (450 mg total) by mouth at bedtime. 270 capsule 0   Melatonin 5 MG CAPS Take by mouth.     sertraline (ZOLOFT) 100 MG tablet Take 2 tablets (200 mg total) by mouth daily. 180 tablet 0   traZODone (DESYREL) 50 MG tablet Take 1  tablet (50 mg total) by mouth at bedtime. 90 tablet 0   lamoTRIgine (LAMICTAL) 25 MG tablet Take 1 tablet (25 mg total) by mouth daily for 14 days, THEN 2 tablets (50 mg total) daily for 14 days. 45 tablet 0   Valbenazine Tosylate (INGREZZA) 40 & 80 MG CPPK Take 40 mg by mouth at bedtime for 7 days, THEN 80 mg at bedtime for 7 days. 2 each 0   Valbenazine Tosylate (INGREZZA) 80 MG CAPS Take 80 mg by mouth at bedtime. 30 capsule 1   No current  facility-administered medications for this visit.    Medication Side Effects: Other: Insomnia with Austedo  Allergies: No Known Allergies  Past Medical History:  Diagnosis Date   Anxiety    Depression     Family History  Problem Relation Age of Onset   Alcohol abuse Father     Social History   Socioeconomic History   Marital status: Single    Spouse name: Not on file   Number of children: Not on file   Years of education: Not on file   Highest education level: Not on file  Occupational History   Not on file  Tobacco Use   Smoking status: Never Smoker   Smokeless tobacco: Never Used  Substance and Sexual Activity   Alcohol use: No   Drug use: No   Sexual activity: Never  Other Topics Concern   Not on file  Social History Narrative   Not on file   Social Determinants of Health   Financial Resource Strain:    Difficulty of Paying Living Expenses:   Food Insecurity:    Worried About Programme researcher, broadcasting/film/video in the Last Year:    Barista in the Last Year:   Transportation Needs:    Freight forwarder (Medical):    Lack of Transportation (Non-Medical):   Physical Activity:    Days of Exercise per Week:    Minutes of Exercise per Session:   Stress:    Feeling of Stress :   Social Connections:    Frequency of Communication with Friends and Family:    Frequency of Social Gatherings with Friends and Family:    Attends Religious Services:    Active Member of Clubs or Organizations:    Attends Engineer, structural:    Marital Status:   Intimate Partner Violence:    Fear of Current or Ex-Partner:    Emotionally Abused:    Physically Abused:    Sexually Abused:     Past Medical History, Surgical history, Social history, and Family history were reviewed and updated as appropriate.   Please see review of systems for further details on the patient's review from today.   Objective:   Physical Exam:  There were  no vitals taken for this visit.  Physical Exam Neurological:     Mental Status: He is alert and oriented to person, place, and time.     Cranial Nerves: No dysarthria.  Psychiatric:        Attention and Perception: Attention and perception normal.        Mood and Affect: Mood is depressed.        Speech: Speech normal.        Behavior: Behavior is cooperative.        Thought Content: Thought content normal. Thought content is not paranoid or delusional. Thought content does not include homicidal or suicidal ideation. Thought content does not include homicidal or suicidal plan.  Cognition and Memory: Cognition and memory normal.        Judgment: Judgment normal.     Comments: Insight intact     Lab Review:  No results found for: NA, K, CL, CO2, GLUCOSE, BUN, CREATININE, CALCIUM, PROT, ALBUMIN, AST, ALT, ALKPHOS, BILITOT, GFRNONAA, GFRAA  No results found for: WBC, RBC, HGB, HCT, PLT, MCV, MCH, MCHC, RDW, LYMPHSABS, MONOABS, EOSABS, BASOSABS  No results found for: POCLITH, LITHIUM   No results found for: PHENYTOIN, PHENOBARB, VALPROATE, CBMZ   .res Assessment: Plan:   Discussed trial of Ingrezza for tardive dyskinesia after patient had insomnia with Austedo since [place] typically can cause some drowsiness and insomnia would be less likely.  Patient agrees to trial of Ingrezza.  Will pull samples of Ingrezza 40 mg at bedtime for 1 week, then increase to 80 mg at bedtime for 1 week.  Will also send prescription of Ingrezza 80 mg at bedtime to [place] [facility]. Discussed potential benefits, risks, and side effects of starting lamotrigine for off label treatment of depression since patient continues to have significant depression with sertraline 200 mg daily. Counseled patient regarding potential benefits, risks, and side effects of Lamictal to include potential risk of Stevens-Johnson syndrome. Advised patient to stop taking Lamictal and contact office immediately if rash develops and  to seek urgent medical attention if rash is severe and/or spreading quickly. Patient agrees to trial of lamotrigine.  Will start lamotrigine 25 mg daily for 2 weeks, then increase to 50 mg daily. Continue sertraline 200 mg daily since this has been effective for his anxiety signs and symptoms. Continue trazodone 50 mg at bedtime for insomnia. Continue lithium 450 mg at bedtime for mood and since this has been helpful for suicidal thoughts. Recommend continuing psychotherapy with Monique Crutchfield, LCSW. Patient to follow-up with this provider in 4 weeks or sooner if clinically indicated. Patient advised to contact office with any questions, adverse effects, or acute worsening in signs and symptoms.  Secundino was seen today for depression, other and medication problem.  Diagnoses and all orders for this visit:  Depression, unspecified depression type -     lamoTRIgine (LAMICTAL) 25 MG tablet; Take 1 tablet (25 mg total) by mouth daily for 14 days, THEN 2 tablets (50 mg total) daily for 14 days. -     sertraline (ZOLOFT) 100 MG tablet; Take 2 tablets (200 mg total) by mouth daily. -     lithium carbonate 150 MG capsule; Take 3 capsules (450 mg total) by mouth at bedtime.  Tardive dyskinesia -     Valbenazine Tosylate (INGREZZA) 80 MG CAPS; Take 80 mg by mouth at bedtime. -     Valbenazine Tosylate (INGREZZA) 40 & 80 MG CPPK; Take 40 mg by mouth at bedtime for 7 days, THEN 80 mg at bedtime for 7 days.  Generalized anxiety disorder -     sertraline (ZOLOFT) 100 MG tablet; Take 2 tablets (200 mg total) by mouth daily.  Insomnia, unspecified type -     traZODone (DESYREL) 50 MG tablet; Take 1 tablet (50 mg total) by mouth at bedtime.     Please see After Visit Summary for patient specific instructions.  No future appointments.  No orders of the defined types were placed in this encounter.     -------------------------------

## 2020-03-24 ENCOUNTER — Telehealth: Payer: Self-pay

## 2020-03-24 NOTE — Telephone Encounter (Signed)
Prior authorization submitted and approved for INGREZZA 80 MG effective 03/23/2020-06/23/2020, 6 months with [employer] ID# [id number]

## 2020-04-11 ENCOUNTER — Other Ambulatory Visit: Payer: Self-pay | Admitting: Psychiatry

## 2020-04-11 DIAGNOSIS — F32A Depression, unspecified: Secondary | ICD-10-CM

## 2020-04-11 NOTE — Telephone Encounter (Signed)
He should be scheduled back soon but not yet

## 2020-05-06 ENCOUNTER — Other Ambulatory Visit: Payer: Self-pay

## 2020-05-06 DIAGNOSIS — G2401 Drug induced subacute dyskinesia: Secondary | ICD-10-CM

## 2020-05-06 MED ORDER — INGREZZA 80 MG PO CAPS: 80.0000 mg | ORAL_CAPSULE | Freq: Every day | ORAL | 1 refills | Status: DC

## 2020-06-01 ENCOUNTER — Telehealth: Payer: Self-pay

## 2020-06-01 NOTE — Telephone Encounter (Signed)
Renewal prior authorization for INGREZZA 80 MG CAPSULES effective 05/31/2020-05/31/2021 PA# [id number] with [employer]

## 2020-06-29 ENCOUNTER — Other Ambulatory Visit: Payer: Self-pay | Admitting: Psychiatry

## 2020-06-29 DIAGNOSIS — G2401 Drug induced subacute dyskinesia: Secondary | ICD-10-CM

## 2020-07-03 NOTE — Telephone Encounter (Signed)
Last apt 08/16

## 2020-07-04 NOTE — Telephone Encounter (Signed)
Left a message for the pt to schedule appt

## 2020-07-28 ENCOUNTER — Other Ambulatory Visit: Payer: Self-pay | Admitting: Psychiatry

## 2020-07-28 DIAGNOSIS — G4700 Insomnia, unspecified: Secondary | ICD-10-CM

## 2020-08-02 NOTE — Telephone Encounter (Signed)
I left a message for pt to call and schedule appointment

## 2020-08-02 NOTE — Telephone Encounter (Signed)
Last apt 08/16,due for 4 week f/up

## 2020-08-26 ENCOUNTER — Telehealth: Payer: Self-pay | Admitting: Psychiatry

## 2020-08-26 DIAGNOSIS — G2401 Drug induced subacute dyskinesia: Secondary | ICD-10-CM

## 2020-08-26 MED ORDER — INGREZZA 80 MG PO CAPS: 1.0000 | ORAL_CAPSULE | Freq: Every day | ORAL | 1 refills | Status: DC

## 2020-08-26 NOTE — Telephone Encounter (Signed)
LM to call for a f/u visit

## 2020-08-26 NOTE — Telephone Encounter (Signed)
Rec'd fax refill request for Ingrezza. Refill sent.

## 2020-08-31 ENCOUNTER — Other Ambulatory Visit: Payer: Self-pay | Admitting: Psychiatry

## 2020-08-31 DIAGNOSIS — G4700 Insomnia, unspecified: Secondary | ICD-10-CM

## 2020-10-08 ENCOUNTER — Emergency Department (HOSPITAL_BASED_OUTPATIENT_CLINIC_OR_DEPARTMENT_OTHER): Payer: BLUE CROSS/BLUE SHIELD

## 2020-10-08 ENCOUNTER — Observation Stay (HOSPITAL_BASED_OUTPATIENT_CLINIC_OR_DEPARTMENT_OTHER)
Admission: EM | Admit: 2020-10-08 | Discharge: 2020-10-09 | Disposition: A | Discharge status: Home / Self Care | Payer: BLUE CROSS/BLUE SHIELD | Attending: Internal Medicine | Admitting: Internal Medicine

## 2020-10-08 ENCOUNTER — Other Ambulatory Visit: Payer: Self-pay

## 2020-10-08 ENCOUNTER — Encounter (HOSPITAL_BASED_OUTPATIENT_CLINIC_OR_DEPARTMENT_OTHER): Payer: Self-pay

## 2020-10-08 DIAGNOSIS — H6691 Otitis media, unspecified, right ear: Secondary | ICD-10-CM | POA: Insufficient documentation

## 2020-10-08 DIAGNOSIS — B356 Tinea cruris: Secondary | ICD-10-CM | POA: Diagnosis not present

## 2020-10-08 DIAGNOSIS — F419 Anxiety disorder, unspecified: Secondary | ICD-10-CM | POA: Diagnosis not present

## 2020-10-08 DIAGNOSIS — H70091 Acute mastoiditis with other complications, right ear: Principal | ICD-10-CM | POA: Insufficient documentation

## 2020-10-08 DIAGNOSIS — Z792 Long term (current) use of antibiotics: Secondary | ICD-10-CM | POA: Diagnosis not present

## 2020-10-08 DIAGNOSIS — F418 Other specified anxiety disorders: Secondary | ICD-10-CM | POA: Diagnosis present

## 2020-10-08 DIAGNOSIS — Z20822 Contact with and (suspected) exposure to covid-19: Secondary | ICD-10-CM | POA: Insufficient documentation

## 2020-10-08 DIAGNOSIS — Z79899 Other long term (current) drug therapy: Secondary | ICD-10-CM | POA: Insufficient documentation

## 2020-10-08 DIAGNOSIS — H7091 Unspecified mastoiditis, right ear: Secondary | ICD-10-CM | POA: Diagnosis present

## 2020-10-08 DIAGNOSIS — H9201 Otalgia, right ear: Secondary | ICD-10-CM | POA: Diagnosis present

## 2020-10-08 DIAGNOSIS — F329 Major depressive disorder, single episode, unspecified: Secondary | ICD-10-CM | POA: Diagnosis not present

## 2020-10-08 LAB — BASIC METABOLIC PANEL
Anion gap: 10 (ref 5–15)
BUN: 13 mg/dL (ref 6–20)
CO2: 25 mmol/L (ref 22–32)
Calcium: 8.7 mg/dL — ABNORMAL LOW (ref 8.9–10.3)
Chloride: 103 mmol/L (ref 98–111)
Creatinine, Ser: 0.92 mg/dL (ref 0.61–1.24)
GFR, Estimated: >60 mL/min (ref >60)
Glucose, Bld: 111 mg/dL — ABNORMAL HIGH (ref 70–99)
Potassium: 4.2 mmol/L (ref 3.5–5.1)
Sodium: 138 mmol/L (ref 135–145)

## 2020-10-08 LAB — CBC WITH DIFFERENTIAL/PLATELET
Abs Immature Granulocytes: 0.03 10*3/uL (ref 0.00–0.07)
Basophils Absolute: 0.1 10*3/uL (ref 0.0–0.1)
Basophils Relative: 1 %
Eosinophils Absolute: 0.2 10*3/uL (ref 0.0–0.5)
Eosinophils Relative: 2 %
HCT: 42.1 % (ref 39.0–52.0)
Hemoglobin: 13.4 g/dL (ref 13.0–17.0)
Immature Granulocytes: 0 %
Lymphocytes Relative: 17 %
Lymphs Abs: 1.8 10*3/uL (ref 0.7–4.0)
MCH: 26.5 pg (ref 26.0–34.0)
MCHC: 31.8 g/dL (ref 30.0–36.0)
MCV: 83.4 fL (ref 80.0–100.0)
Monocytes Absolute: 0.6 10*3/uL (ref 0.1–1.0)
Monocytes Relative: 5 %
Neutro Abs: 8 10*3/uL — ABNORMAL HIGH (ref 1.7–7.7)
Neutrophils Relative %: 75 %
Platelets: 310 10*3/uL (ref 150–400)
RBC: 5.05 MIL/uL (ref 4.22–5.81)
RDW: 15.4 % (ref 11.5–15.5)
WBC: 10.7 10*3/uL — ABNORMAL HIGH (ref 4.0–10.5)
nRBC: 0 % (ref 0.0–0.2)

## 2020-10-08 LAB — SARS CORONAVIRUS 2 BY RT PCR (HOSPITAL ORDER, PERFORMED IN ~~LOC~~ HOSPITAL LAB): SARS Coronavirus 2: NEGATIVE

## 2020-10-08 MED ORDER — PIPERACILLIN-TAZOBACTAM 3.375 G IVPB 30 MIN
3.3750 g | Freq: Once | INTRAVENOUS | Status: AC
  Administered 2020-10-08: 3.375 g via INTRAVENOUS
  Filled 2020-10-08: qty 50

## 2020-10-08 MED ORDER — ACETAMINOPHEN 650 MG RE SUPP: 650.0000 mg | Freq: Four times a day (QID) | RECTAL | Status: DC | PRN

## 2020-10-08 MED ORDER — ONDANSETRON HCL 4 MG PO TABS: 4.0000 mg | ORAL_TABLET | Freq: Four times a day (QID) | ORAL | Status: DC | PRN

## 2020-10-08 MED ORDER — ENOXAPARIN SODIUM 40 MG/0.4ML ~~LOC~~ SOLN
40.0000 mg | SUBCUTANEOUS | Status: DC
  Administered 2020-10-08: 40 mg via SUBCUTANEOUS
  Filled 2020-10-08: qty 0.4

## 2020-10-08 MED ORDER — ONDANSETRON HCL 4 MG/2ML IJ SOLN: 4.0000 mg | Freq: Four times a day (QID) | INTRAMUSCULAR | Status: DC | PRN

## 2020-10-08 MED ORDER — SODIUM CHLORIDE 0.9 % IV SOLN: INTRAVENOUS | Status: DC

## 2020-10-08 MED ORDER — MORPHINE SULFATE (PF) 2 MG/ML IV SOLN
2.0000 mg | INTRAVENOUS | Status: DC | PRN
  Administered 2020-10-08 – 2020-10-09 (×3): 2 mg via INTRAVENOUS
  Filled 2020-10-08 (×4): qty 1

## 2020-10-08 MED ORDER — SODIUM CHLORIDE 0.9 % IV SOLN
INTRAVENOUS | Status: DC | PRN
  Administered 2020-10-08: 250 mL via INTRAVENOUS

## 2020-10-08 MED ORDER — IOHEXOL 300 MG/ML  SOLN
100.0000 mL | Freq: Once | INTRAMUSCULAR | Status: AC | PRN
  Administered 2020-10-08: 75 mL via INTRAVENOUS

## 2020-10-08 MED ORDER — FENTANYL CITRATE (PF) 100 MCG/2ML IJ SOLN
50.0000 ug | Freq: Once | INTRAMUSCULAR | Status: AC
  Administered 2020-10-08: 50 ug via INTRAVENOUS
  Filled 2020-10-08: qty 2

## 2020-10-08 MED ORDER — PIPERACILLIN-TAZOBACTAM 3.375 G IVPB
3.3750 g | Freq: Three times a day (TID) | INTRAVENOUS | Status: DC
  Administered 2020-10-08 – 2020-10-09 (×2): 3.375 g via INTRAVENOUS
  Filled 2020-10-08 (×4): qty 50

## 2020-10-08 MED ORDER — ACETAMINOPHEN 325 MG PO TABS
650.0000 mg | ORAL_TABLET | Freq: Four times a day (QID) | ORAL | Status: DC | PRN
  Administered 2020-10-09: 650 mg via ORAL
  Filled 2020-10-08: qty 2

## 2020-10-08 MED ORDER — PIPERACILLIN-TAZOBACTAM IN DEX 2-0.25 GM/50ML IV SOLN: 2.2500 g | Freq: Once | INTRAVENOUS | Status: DC

## 2020-10-08 NOTE — ED Notes (Signed)
Attempted x 2 to obtain IV/blood; unsuccessful. 

## 2020-10-08 NOTE — Plan of Care (Signed)
[facility] transfer.  Mr. Vandevoorde is a 30 year old male with pmh anxiety and depression who presented with pain and drainage from his right ear.  Labs significant for WBC 10.7. VS stable. Imaging studies significant for right otitis externa with concern for right-sided mastoiditis.  Dr. Jearld Fenton of ENT was formally consulted and recommended admission for IV antibiotics.  Patient had been started on IV Zosyn.  Accepted to a MedSurg bed as inpatient.

## 2020-10-08 NOTE — Progress Notes (Signed)
Sent chat message to Dr. Mikeal Hawthorne:  The patient just arrived from [facility]. There are limited orders. Do you want him on tele, monitor O2? Diet? The patient wants to be Full Code. Will you please update his chart? Thanks!   He replied he is working on admits now.

## 2020-10-08 NOTE — H&P (Signed)
History and Physical   Grady Mohabir QMG:[id number] DOB: Feb 25, 1991 DOA: 10/08/2020  Referring MD/NP/PA: Haze Rushing, PA  PCP: Elijio Miles., MD   Outpatient Specialists: None  Patient coming from: Home via med [facility]  Chief Complaint: Right ear pain and ear pain  HPI: Bryce Dillon is a 30 y.o. male with medical history significant of anxiety with depression, morbid obesity who went to urgent care as to rule out possible mastoiditis.  Patient has had recurrent ear infections in the last few months and has been going to different antibiotics.  He most recently had amoxicillin and Cipro eardrops.  He started having the about a month ago.  That was on the left ear.  Today however he came in with concern of increasing pain and drainage from the right ear.  Also sensation of ear fullness.  Patient is having difficulty with mastication.  There is pain around the anterior aspect of the right ear.  He was noted to have tenderness to palpation.  Patient also has fever and chills.  He also has had right-sided headache among other things but no other findings.  No injuries.  No trauma.  Patient has had recurrent skin infections and jock itch.  He has been given some creams for that.  He had work-up done in the ER and suspected right-sided mastoiditis so he is being admitted for IV antibiotics having failed outpatient oral antibiotics..  ED Course: Temperature is 99.6 blood pressure 159/98, pulse 99 respirate 29 oxygen sat 99% room air.  Chemistry mostly within normal except for glucose of 111.  White count 10.7.  COVID-19 screen is negative.  CT temporal bones shows nonspecific soft tissue thickening and opacification of the greater than bony right external auditory canal.  Note erosive changes.  Suspicion for otitis externa and media.  There is also mastoid opacification.  Patient being admitted with possible early mastoiditis on the right.  Review of Systems: As per HPI otherwise 10 point  review of systems negative.    Past Medical History:  Diagnosis Date  . Anxiety   . Depression     Past Surgical History:  Procedure Laterality Date  . SLEEVE GASTROPLASTY       reports that he has never smoked. He has never used smokeless tobacco. He reports that he does not drink alcohol and does not use drugs.  No Known Allergies  Family History  Problem Relation Age of Onset  . Alcohol abuse Father      Prior to Admission medications   Medication Sig Start Date End Date Taking? Authorizing Provider  diphenhydrAMINE (BENADRYL) 25 MG tablet Take 25 mg by mouth at bedtime as needed for sleep.    [provider]  lamoTRIgine (LAMICTAL) 25 MG tablet Take 1 tablet (25 mg total) by mouth daily for 14 days, THEN 2 tablets (50 mg total) daily for 14 days. 03/21/20 04/18/20  Corie Chiquito, PMHNP  lithium carbonate 150 MG capsule Take 3 capsules (450 mg total) by mouth at bedtime. 03/21/20   Corie Chiquito, PMHNP  Melatonin 5 MG CAPS Take by mouth.    [provider]  sertraline (ZOLOFT) 100 MG tablet Take 2 tablets (200 mg total) by mouth daily. 03/21/20   Corie Chiquito, PMHNP  traZODone (DESYREL) 50 MG tablet TAKE 1 TABLET BY MOUTH EVERYDAY AT BEDTIME 08/31/20   Corie Chiquito, PMHNP  Valbenazine Tosylate [facility]) 40 & 80 MG CPPK Take 40 mg by mouth at bedtime for 7 days, THEN 80 mg  at bedtime for 7 days. 03/21/20 04/04/20  Corie Chiquito, PMHNP  Valbenazine Tosylate [facility]) 80 MG CAPS Take 1 capsule by mouth at bedtime. 08/26/20   Corie Chiquito, PMHNP    Physical Exam: Vitals:   10/08/20 1400 10/08/20 1720 10/08/20 2008 10/08/20 2017  BP: 131/76   135/84  Pulse: 92   99  Resp: 18     Temp:  98.8 F (37.1 C)  99.6 F (37.6 C)  TempSrc:  Oral  Oral  SpO2: 99%   100%  Weight:   (!) 224.1 kg   Height:   5\' 11"  (1.803 m)       Constitutional: Morbidly obese with no distress Vitals:   10/08/20 1400 10/08/20 1720 10/08/20 2008 10/08/20 2017  BP:  131/76   135/84  Pulse: 92   99  Resp: 18     Temp:  98.8 F (37.1 C)  99.6 F (37.6 C)  TempSrc:  Oral  Oral  SpO2: 99%   100%  Weight:   (!) 224.1 kg   Height:   5\' 11"  (1.803 m)    Eyes: PERRL, lids and conjunctivae normal ENMT: Right facial swelling, tender, no obvious abscess collection Neck: normal, supple, no masses, no thyromegaly Respiratory: clear to auscultation bilaterally, no wheezing, no crackles. Normal respiratory effort. No accessory muscle use.  Cardiovascular: Regular rate and rhythm, no murmurs / rubs / gallops. No extremity edema. 2+ pedal pulses. No carotid bruits.  Abdomen: no tenderness, no masses palpated. No hepatosplenomegaly. Bowel sounds positive.  Musculoskeletal: no clubbing / cyanosis. No joint deformity upper and lower extremities. Good ROM, no contractures. Normal muscle tone.  Skin: no rashes, lesions, ulcers. No induration Neurologic: CN 2-12 grossly intact. Sensation intact, DTR normal. Strength 5/5 in all 4.  Psychiatric: Normal judgment and insight. Alert and oriented x 3. Normal mood.     Labs on Admission: I have personally reviewed following labs and imaging studies  CBC: Recent Labs  Lab 10/08/20 1236  WBC 10.7*  NEUTROABS 8.0*  HGB 13.4  HCT 42.1  MCV 83.4  PLT 310   Basic Metabolic Panel: Recent Labs  Lab 10/08/20 1236  NA 138  K 4.2  CL 103  CO2 25  GLUCOSE 111*  BUN 13  CREATININE 0.92  CALCIUM 8.7*   GFR: Estimated Creatinine Clearance: 221.8 mL/min (by C-G formula based on SCr of 0.92 mg/dL). Liver Function Tests: No results for input(s): AST, ALT, ALKPHOS, BILITOT, PROT, ALBUMIN in the last 168 hours. No results for input(s): LIPASE, AMYLASE in the last 168 hours. No results for input(s): AMMONIA in the last 168 hours. Coagulation Profile: No results for input(s): INR, PROTIME in the last 168 hours. Cardiac Enzymes: No results for input(s): CKTOTAL, CKMB, CKMBINDEX, TROPONINI in the last 168 hours. BNP (last  3 results) No results for input(s): PROBNP in the last 8760 hours. HbA1C: No results for input(s): HGBA1C in the last 72 hours. CBG: No results for input(s): GLUCAP in the last 168 hours. Lipid Profile: No results for input(s): CHOL, HDL, LDLCALC, TRIG, CHOLHDL, LDLDIRECT in the last 72 hours. Thyroid Function Tests: No results for input(s): TSH, T4TOTAL, FREET4, T3FREE, THYROIDAB in the last 72 hours. Anemia Panel: No results for input(s): VITAMINB12, FOLATE, FERRITIN, TIBC, IRON, RETICCTPCT in the last 72 hours. Urine analysis: No results found for: COLORURINE, APPEARANCEUR, LABSPEC, PHURINE, GLUCOSEU, HGBUR, BILIRUBINUR, KETONESUR, PROTEINUR, UROBILINOGEN, NITRITE, LEUKOCYTESUR Sepsis Labs: @LABRCNTIP (procalcitonin:4,lacticidven:4) ) Recent Results (from the past 240 hour(s))  SARS Coronavirus 2 by RT  PCR (hospital order, [facility])     Status: None   Collection Time: 10/08/20  4:10 PM  Result Value Ref Range Status   SARS Coronavirus 2 NEGATIVE NEGATIVE Final    Comment: (NOTE) SARS-CoV-2 target nucleic acids are NOT DETECTED.  The SARS-CoV-2 RNA is generally detectable in upper and lower respiratory specimens during the acute phase of infection. The lowest concentration of SARS-CoV-2 viral copies this assay can detect is 250 copies / mL. A negative result does not preclude SARS-CoV-2 infection and should not be used as the sole basis for treatment or other patient management decisions.  A negative result may occur with improper specimen collection / handling, submission of specimen other than nasopharyngeal swab, presence of viral mutation(s) within the areas targeted by this assay, and inadequate number of viral copies (<250 copies / mL). A negative result must be combined with clinical observations, patient history, and epidemiological information.  Fact Sheet for Patients:   BoilerBrush.com.cyhttps://www.fda.gov/media/136312/download  Fact Sheet for  Healthcare Providers: https://pope.com/https://www.fda.gov/media/136313/download  This test is not yet approved or  cleared by the [place] FDA and has been authorized for detection and/or diagnosis of SARS-CoV-2 by FDA under an Emergency Use Authorization (EUA).  This EUA will remain in effect (meaning this test can be used) for the duration of the COVID-19 declaration under Section 564(b)(1) of the Act, 21 U.S.C. section 360bbb-3(b)(1), unless the authorization is terminated or revoked sooner.  Performed at [facility] [place], [address] [place], [place] [address]      Radiological Exams on Admission: CT Temporal Bones W Contrast  Result Date: 10/08/2020 CLINICAL DATA:  Left ear pain, concern for mastoiditis EXAM: CT TEMPORAL BONES WITH CONTRAST TECHNIQUE: Axial and coronal plane CT imaging of the petrous temporal bones was performed with thin-collimation image reconstruction after intravenous contrast administration. Multiplanar CT image reconstructions were also generated. CONTRAST:  75mL OMNIPAQUE IOHEXOL 300 MG/ML  SOLN COMPARISON:  None. FINDINGS: Right temporal bone: Soft tissue thickening along the cartilaginous external auditory canal with occlusion of the canal. Nonspecific contiguous opacification of the partially occluded bony external auditory canal. No erosive changes. Tympanic membrane is not well evaluated due to adjacent density. Partial opacification of the middle ear including the mesotympanum. Ossicles are unremarkable. Cochlea and semicircular canals are unremarkable. Tegmen is intact. Facial nerve demonstrates normal course. Patchy mastoid opacification. Left temporal bone: Mild soft tissue thickening along the external auditory canal. Tympanic membrane is not thickened. Middle ear cleft and ossicles are unremarkable. Cochlea and semicircular canals are unremarkable. Tegmen is intact. Facial nerve demonstrates normal course. Minor mastoid opacification. IMPRESSION: Nonspecific  soft tissue thickening/opacification of the cartilaginous greater than bony right external auditory canal. No erosive changes. May reflect otitis externa and media. No erosive changes. Nonspecific patchy right mastoid opacification. No evidence of coalescent mastoiditis. Electronically Signed   By: Guadlupe SpanishPraneil  Patel M.D.   On: 10/08/2020 13:41      Assessment/Plan Principal Problem:   Mastoiditis of right side Active Problems:   Morbid obesity (HCC)   Anxiety associated with depression     #1 right-sided mastoiditis: Patient will be admitted for IV antibiotics.  Initiated on IV Zosyn.  Pain management and supportive care.  Soft diet ordered.  #2 morbid obesity: Dietary counseling.  #3 jock itch: Topical Benadryl.  May consider nystatin cream as well.  Probably secondary to excessive sweating.  #4 anxiety disorder: Resume home regimen once confirmed.   DVT prophylaxis: Lovenox Code Status: Full  code Family Communication: Mother at bedside Disposition Plan: Home Consults called: None but recommend outpatient follow-up with ENT Admission status: Inpatient  Severity of Illness: The appropriate patient status for this patient is INPATIENT. Inpatient status is judged to be reasonable and necessary in order to provide the required intensity of service to ensure the patient's safety. The patient's presenting symptoms, physical exam findings, and initial radiographic and laboratory data in the context of their chronic comorbidities is felt to place them at high risk for further clinical deterioration. Furthermore, it is not anticipated that the patient will be medically stable for discharge from the hospital within 2 midnights of admission. The following factors support the patient status of inpatient.   " The patient's presenting symptoms include facial pain. " The worrisome physical exam findings include morbidly obese with tenderness of the right side of the face. " The initial radiographic  and laboratory data are worrisome because of possible otitis externa versus mastoiditis. " The chronic co-morbidities include anxiety disorder.   * I certify that at the point of admission it is my clinical judgment that the patient will require inpatient hospital care spanning beyond 2 midnights from the point of admission due to high intensity of service, high risk for further deterioration and high frequency of surveillance required.Lonia Blood MD [facility] Pager [phone]  If 7PM-7AM, please contact night-coverage [email or web address] Password [facility]  10/08/2020, 9:59 PM

## 2020-10-08 NOTE — ED Provider Notes (Signed)
[facility] EMERGENCY DEPARTMENT Provider Note   CSN: [id number] Arrival date & time: 10/08/20  1048     History Chief Complaint  Patient presents with  . Otalgia  . Recurrent Skin Infections    Bryce Dillon is a 30 y.o. male who presents from urgent care for rule out of mastoiditis.  Patient endorses several ear infections over the last few months, most recently on amoxicillin and ciprofloxacin drops approximately 1 month ago for left ear infection.  Presents today with concern for 2 days of increasing pain and drainage from his right ear as well as sensation of ear fullness.  He now endorses pain anterior and posterior to his right ear, with tenderness to palpation of the mastoid at urgent care. He endorses some chills at home but denies fevers.  Endorses right sided headaches, but denies blurred vision, double vision, change in hearing.  Additionally has history of "jock itch" which was prescribed topical treatment by urgent care today. He does not need this concern addressed in the ED at this time.   I personally reviewed this patient's medical hours.  Has history of anxiety depression as well as deep gastroplasty, additionally severe obesity.  HPI     Past Medical History:  Diagnosis Date  . Anxiety   . Depression     Patient Active Problem List   Diagnosis Date Noted  . Mastoiditis of right side 10/08/2020    Past Surgical History:  Procedure Laterality Date  . SLEEVE GASTROPLASTY         Family History  Problem Relation Age of Onset  . Alcohol abuse Father     Social History   Tobacco Use  . Smoking status: Never Smoker  . Smokeless tobacco: Never Used  Substance Use Topics  . Alcohol use: No  . Drug use: No    Home Medications Prior to Admission medications   Medication Sig Start Date End Date Taking? Authorizing Provider  diphenhydrAMINE (BENADRYL) 25 MG tablet Take 25 mg by mouth at bedtime as needed for sleep.    [provider]  lamoTRIgine (LAMICTAL) 25 MG tablet Take 1 tablet (25 mg total) by mouth daily for 14 days, THEN 2 tablets (50 mg total) daily for 14 days. 03/21/20 04/18/20  Corie Chiquito, PMHNP  lithium carbonate 150 MG capsule Take 3 capsules (450 mg total) by mouth at bedtime. 03/21/20   Corie Chiquito, PMHNP  Melatonin 5 MG CAPS Take by mouth.    [provider]  sertraline (ZOLOFT) 100 MG tablet Take 2 tablets (200 mg total) by mouth daily. 03/21/20   Corie Chiquito, PMHNP  traZODone (DESYREL) 50 MG tablet TAKE 1 TABLET BY MOUTH EVERYDAY AT BEDTIME 08/31/20   Corie Chiquito, PMHNP  Valbenazine Tosylate [facility]) 40 & 80 MG CPPK Take 40 mg by mouth at bedtime for 7 days, THEN 80 mg at bedtime for 7 days. 03/21/20 04/04/20  Corie Chiquito, PMHNP  Valbenazine Tosylate [facility]) 80 MG CAPS Take 1 capsule by mouth at bedtime. 08/26/20   Corie Chiquito, PMHNP    Allergies    Patient has no known allergies.  Review of Systems   Review of Systems  Constitutional: Positive for chills. Negative for appetite change, diaphoresis, fatigue and fever.  HENT: Positive for ear discharge and ear pain. Negative for congestion, dental problem, drooling, facial swelling, hearing loss, mouth sores, nosebleeds, postnasal drip, rhinorrhea, sinus pressure, sinus pain, tinnitus and voice change.   Eyes: Negative.   Respiratory: Negative.   Cardiovascular:  Negative.   Gastrointestinal: Negative.   Genitourinary: Negative.   Musculoskeletal: Negative.   Skin: Positive for rash.  Neurological: Negative.   Hematological: Negative.   Psychiatric/Behavioral: Negative.     Physical Exam Updated Vital Signs BP 131/76   Pulse 92   Temp 98.2 F (36.8 C) (Oral)   Resp 18   SpO2 99%   Physical Exam Vitals and nursing note reviewed.  Constitutional:      Appearance: He is morbidly obese. He is not ill-appearing.  HENT:     Head: Normocephalic and atraumatic.     Right Ear: Decreased hearing noted. Drainage,  swelling and tenderness present. No laceration. There is mastoid tenderness.     Left Ear: Hearing, tympanic membrane, ear canal and external ear normal.     Ears:      Comments: Tenderness palpation of the right mastoid with mild edema without erythema or skin changes.    Nose: Nose normal.     Mouth/Throat:     Mouth: Mucous membranes are moist.     Pharynx: Oropharynx is clear. No oropharyngeal exudate or posterior oropharyngeal erythema.  Eyes:     General: No scleral icterus.       Right eye: No discharge.        Left eye: No discharge.     Extraocular Movements: Extraocular movements intact.     Conjunctiva/sclera: Conjunctivae normal.     Pupils: Pupils are equal, round, and reactive to light.  Cardiovascular:     Rate and Rhythm: Normal rate and regular rhythm.     Pulses: Normal pulses.     Heart sounds: Normal heart sounds. No murmur heard.   Pulmonary:     Effort: Pulmonary effort is normal.     Breath sounds: No wheezing.  Musculoskeletal:        General: No deformity.     Cervical back: Neck supple. No tenderness.     Right lower leg: No edema.     Left lower leg: No edema.  Lymphadenopathy:     Cervical: No cervical adenopathy.  Skin:    General: Skin is warm and dry.     Capillary Refill: Capillary refill takes less than 2 seconds.  Neurological:     General: No focal deficit present.     Mental Status: He is alert and oriented to person, place, and time. Mental status is at baseline.  Psychiatric:        Mood and Affect: Mood normal.     ED Results / Procedures / Treatments   Labs (all labs ordered are listed, but only abnormal results are displayed) Labs Reviewed  BASIC METABOLIC PANEL - Abnormal; Notable for the following components:      Result Value   Glucose, Bld 111 (*)    Calcium 8.7 (*)    All other components within normal limits  CBC WITH DIFFERENTIAL/PLATELET - Abnormal; Notable for the following components:   WBC 10.7 (*)    Neutro Abs  8.0 (*)    All other components within normal limits  RESP PANEL BY RT-PCR (FLU A&B, COVID) [id number]    EKG None  Radiology CT Temporal Bones W Contrast  Result Date: 10/08/2020 CLINICAL DATA:  Left ear pain, concern for mastoiditis EXAM: CT TEMPORAL BONES WITH CONTRAST TECHNIQUE: Axial and coronal plane CT imaging of the petrous temporal bones was performed with thin-collimation image reconstruction after intravenous contrast administration. Multiplanar CT image reconstructions were also generated. CONTRAST:  33mL OMNIPAQUE IOHEXOL 300 MG/ML  SOLN COMPARISON:  None. FINDINGS: Right temporal bone: Soft tissue thickening along the cartilaginous external auditory canal with occlusion of the canal. Nonspecific contiguous opacification of the partially occluded bony external auditory canal. No erosive changes. Tympanic membrane is not well evaluated due to adjacent density. Partial opacification of the middle ear including the mesotympanum. Ossicles are unremarkable. Cochlea and semicircular canals are unremarkable. Tegmen is intact. Facial nerve demonstrates normal course. Patchy mastoid opacification. Left temporal bone: Mild soft tissue thickening along the external auditory canal. Tympanic membrane is not thickened. Middle ear cleft and ossicles are unremarkable. Cochlea and semicircular canals are unremarkable. Tegmen is intact. Facial nerve demonstrates normal course. Minor mastoid opacification. IMPRESSION: Nonspecific soft tissue thickening/opacification of the cartilaginous greater than bony right external auditory canal. No erosive changes. May reflect otitis externa and media. No erosive changes. Nonspecific patchy right mastoid opacification. No evidence of coalescent mastoiditis. Electronically Signed   By: Guadlupe SpanishPraneil  Patel M.D.   On: 10/08/2020 13:41    Procedures Procedures   Medications Ordered in ED Medications  piperacillin-tazobactam (ZOSYN) IVPB 2.25 g (has no administration in time  range)  0.9 %  sodium chloride infusion (has no administration in time range)  iohexol (OMNIPAQUE) 300 MG/ML solution 100 mL (75 mLs Intravenous Contrast Given 10/08/20 1256)  fentaNYL (SUBLIMAZE) injection 50 mcg (50 mcg Intravenous Given 10/08/20 1430)    ED Course  I have reviewed the triage vital signs and the nursing notes.  Pertinent labs & imaging results that were available during my care of the patient were reviewed by me and considered in my medical decision making (see chart for details).  Clinical Course as of 10/08/20 1537  Sat Oct 08, 2020  1453 Consult call received from ENT, Dr. Jearld FentonByers, who recommends admission to the hospital for IV antibiotics and ENT consultation.  He prefers admission to [facility].  He recommends Zosyn or Unasyn for IV antibiotic therapy at this time. Appreciate his collaboration in the care of this patient.  [RS]  1515 Consult placed to hospitalist, Dr. Katrinka [facility]Smith [facility], was agreeable to admitting this patient to his service and consulting ENT once the patient arrives.  Appreciate his collaboration in the care of this patient. [RS]    Clinical Course User Index [RS] Sponseller, Eugene Gaviaebekah R, PA-C   MDM Rules/Calculators/A&P                         30 year old male presents with concern for right-sided ear pain and drainage x2 days, now with tenderness location of the mastoid noted at urgent care this morning.  Sent to the emergency department for rule out of mastoiditis.  Hypertensive on intake, vital signs otherwise normal.  Cardiopulmonary exam is normal, abdominal exam is benign.  HEENT exam revealed normal left ear, right ear with extensive edema and erythema of the external canal, as well as diffuse watery to yellow drainage.  Patient has tenderness to palpation anterior to the ear as well as with palpation of the right mastoid with mild swelling of the auricle itself. Will proceed with basic laboratory studies and CT temporal bones  with contrast.  CBC with mild leukocytosis of 10.7.  BMP unremarkable.  CT scan with Nonspecific patchy right mastoid opacification without evidence of bony erosion.  There is no evidence of coalescent mastoiditis, however after discussion with radiologist, Dr. Allena KatzPatel, there is concern for spread of the infection into the mastoid that has not yet caused bony erosion.  Consult placed to ENT but as above, who recommended proceeding with Zosyn and medical admission to [facility] where ENT will evaluate. Patient admitted to hospital medicine service, with accepting physician, Dr. Linus Mako started in the ED.  Disposition plan discussed with the patient and his mother at the bedside.  They voiced understanding of his medical evaluation and treatment plan.  Each of their questions was answered to their expressed inspection.  They are amenable to plan for admission to the hospital this time.  This chart was dictated using voice recognition software, Dragon. Despite the best efforts of this provider to proofread and correct errors, errors may still occur which can change documentation meaning.  Final Clinical Impression(s) / ED Diagnoses Final diagnoses:  Mastoiditis of right side    Rx / DC Orders ED Discharge Orders    None       Sherrilee Gilles 10/08/20 1540    Benjiman Core, MD 10/09/20 1523

## 2020-10-08 NOTE — Progress Notes (Signed)
Pharmacy Antibiotic Note  Bryce Dillon is a 30 y.o. male admitted on 10/08/2020 with cellulitis.  Pharmacy has been consulted for Zosyn dosing.  WBC 10.7. AF. SCR wnl   Plan: -Zosyn 3.375 gm IV Q 8 hours (EI infusion)  -Monitor CBC, renal fx, cultures and clinical progress     Temp (24hrs), Avg:98.5 F (36.9 C), Min:98.2 F (36.8 C), Max:98.8 F (37.1 C)  Recent Labs  Lab 10/08/20 1236  WBC 10.7*  CREATININE 0.92    CrCl cannot be calculated (Unknown ideal weight.).    No Known Allergies  Antimicrobials this admission: Zosyn 3/5 >>   Dose adjustments this admission:  Microbiology results:   Thank you for allowing pharmacy to be a part of this patient's care.  Vinnie Level, PharmD., BCPS, BCCCP Clinical Pharmacist Please refer to [facility] for unit-specific pharmacist

## 2020-10-08 NOTE — ED Triage Notes (Signed)
He c/o ~ 3 episodes of otitis externa "for the past few months". He is her today with c/o left-sided ear ache. He also tells me he has "a problem with recurring jock itch". He is ambulatory and in no distress.

## 2020-10-08 NOTE — ED Notes (Signed)
Attempted to call report; this RN contact info provided for callback 

## 2020-10-09 ENCOUNTER — Encounter (HOSPITAL_COMMUNITY): Payer: Self-pay | Admitting: Internal Medicine

## 2020-10-09 DIAGNOSIS — H7091 Unspecified mastoiditis, right ear: Secondary | ICD-10-CM

## 2020-10-09 LAB — COMPREHENSIVE METABOLIC PANEL
ALT: 25 U/L (ref 0–44)
AST: 15 U/L (ref 15–41)
Albumin: 2.9 g/dL — ABNORMAL LOW (ref 3.5–5.0)
Alkaline Phosphatase: 69 U/L (ref 38–126)
Anion gap: 10 (ref 5–15)
BUN: 8 mg/dL (ref 6–20)
CO2: 22 mmol/L (ref 22–32)
Calcium: 8.3 mg/dL — ABNORMAL LOW (ref 8.9–10.3)
Chloride: 104 mmol/L (ref 98–111)
Creatinine, Ser: 0.9 mg/dL (ref 0.61–1.24)
GFR, Estimated: >60 mL/min (ref >60)
Glucose, Bld: 110 mg/dL — ABNORMAL HIGH (ref 70–99)
Potassium: 3.3 mmol/L — ABNORMAL LOW (ref 3.5–5.1)
Sodium: 136 mmol/L (ref 135–145)
Total Bilirubin: 1.4 mg/dL — ABNORMAL HIGH (ref 0.3–1.2)
Total Protein: 6.2 g/dL — ABNORMAL LOW (ref 6.5–8.1)

## 2020-10-09 LAB — CBC
HCT: 40 % (ref 39.0–52.0)
Hemoglobin: 13.2 g/dL (ref 13.0–17.0)
MCH: 27 pg (ref 26.0–34.0)
MCHC: 33 g/dL (ref 30.0–36.0)
MCV: 82 fL (ref 80.0–100.0)
Platelets: 215 10*3/uL (ref 150–400)
RBC: 4.88 MIL/uL (ref 4.22–5.81)
RDW: 15.5 % (ref 11.5–15.5)
WBC: 9.7 10*3/uL (ref 4.0–10.5)
nRBC: 0 % (ref 0.0–0.2)

## 2020-10-09 LAB — PROTIME-INR
INR: 1.1 (ref 0.8–1.2)
Prothrombin Time: 13.8 seconds (ref 11.4–15.2)

## 2020-10-09 LAB — ABO/RH: ABO/RH(D): O POS

## 2020-10-09 LAB — TYPE AND SCREEN
ABO/RH(D): O POS
Antibody Screen: NEGATIVE

## 2020-10-09 LAB — HIV ANTIBODY (ROUTINE TESTING W REFLEX): HIV Screen 4th Generation wRfx: NONREACTIVE

## 2020-10-09 MED ORDER — ACETAMINOPHEN 500 MG PO TABS: 500.0000 mg | ORAL_TABLET | Freq: Three times a day (TID) | ORAL | 0 refills | Status: AC | PRN

## 2020-10-09 MED ORDER — SODIUM CHLORIDE 0.9 % IV SOLN: INTRAVENOUS | Status: DC

## 2020-10-09 MED ORDER — CIPROFLOXACIN-DEXAMETHASONE 0.3-0.1 % OT SUSP
4.0000 [drp] | Freq: Two times a day (BID) | OTIC | Status: DC
  Filled 2020-10-09 (×2): qty 7.5

## 2020-10-09 MED ORDER — MORPHINE SULFATE (PF) 2 MG/ML IV SOLN
2.0000 mg | INTRAVENOUS | Status: DC | PRN
Start: 2020-10-09 — End: 2020-10-09

## 2020-10-09 MED ORDER — CIPROFLOXACIN-DEXAMETHASONE 0.3-0.1 % OT SUSP
4.0000 [drp] | Freq: Two times a day (BID) | OTIC | 0 refills | Status: DC
Start: 2020-10-09 — End: 2021-04-26

## 2020-10-09 MED ORDER — CIPROFLOXACIN HCL 500 MG PO TABS: 500.0000 mg | ORAL_TABLET | Freq: Two times a day (BID) | ORAL | 0 refills | Status: AC

## 2020-10-09 MED ORDER — KETOROLAC TROMETHAMINE 30 MG/ML IJ SOLN
30.0000 mg | Freq: Three times a day (TID) | INTRAMUSCULAR | Status: DC | PRN
  Administered 2020-10-09: 30 mg via INTRAVENOUS
  Filled 2020-10-09 (×2): qty 1

## 2020-10-09 MED ORDER — POTASSIUM CHLORIDE CRYS ER 20 MEQ PO TBCR: 40.0000 meq | EXTENDED_RELEASE_TABLET | Freq: Once | ORAL | Status: DC

## 2020-10-09 NOTE — Plan of Care (Signed)
Patient reports pain in right ear, Morphine given every 2 hours with pain rating as high as 8/10 scale.  Message sent to MD to see if patient could take PO pain medications to last longer. Pt reports that he has to eat soft foods due to the pain in the right ear.    Problem: Education: Goal: Knowledge of General Education information will improve Description: Including pain rating scale, medication(s)/side effects and non-pharmacologic comfort measures Outcome: Progressing   Problem: Health Behavior/Discharge Planning: Goal: Ability to manage health-related needs will improve Outcome: Progressing   Problem: Clinical Measurements: Goal: Ability to maintain clinical measurements within normal limits will improve Outcome: Progressing Goal: Will remain free from infection Outcome: Progressing Goal: Diagnostic test results will improve Outcome: Progressing Goal: Respiratory complications will improve Outcome: Progressing Goal: Cardiovascular complication will be avoided Outcome: Progressing

## 2020-10-09 NOTE — Progress Notes (Signed)
Sent chat msg. To Jimmy Picket:  Hi! The patient is ready for d/c. Just waiting on ear drops from pharmacy. His Mom is here and will take him home. Anything needed on your end?

## 2020-10-09 NOTE — Consult Note (Signed)
Reason for Consult:mastoiditis Referring Physician: hospitalist  Bryce Dillon is an 30 y.o. male.  HPI: hx of recurring ear infections for a couple of months.  He has not had any new issues with his sinuses or nose.  He only could relay a issue with he is having with jock itch and he is worried he might be contaminating his ear canals.  He otherwise has not had a history of otitis media or otitis externa.  Right now he has had 3-day history of right ear pain.  This is a little bit more pain than typical infection.  He always gets decreased hearing and this time is the same.  No vertigo.  No facial nerve weakness.  He underwent a CT scan which suggested there was some mastoiditis.  Past Medical History:  Diagnosis Date  . Anxiety   . Depression     Past Surgical History:  Procedure Laterality Date  . SLEEVE GASTROPLASTY      Family History  Problem Relation Age of Onset  . Alcohol abuse Father     Social History:  reports that he has never smoked. He has never used smokeless tobacco. He reports that he does not drink alcohol and does not use drugs.  Allergies: No Known Allergies  Medications: I have reviewed the patient's current medications.  Results for orders placed or performed during the hospital encounter of 10/08/20 (from the past 48 hour(s))  Basic metabolic panel     Status: Abnormal   Collection Time: 10/08/20 12:36 PM  Result Value Ref Range   Sodium 138 135 - 145 mmol/L   Potassium 4.2 3.5 - 5.1 mmol/L   Chloride 103 98 - 111 mmol/L   CO2 25 22 - 32 mmol/L   Glucose, Bld 111 (H) 70 - 99 mg/dL    Comment: Glucose reference range applies only to samples taken after fasting for at least 8 hours.   BUN 13 6 - 20 mg/dL   Creatinine, Ser 5.28 0.61 - 1.24 mg/dL   Calcium 8.7 (L) 8.9 - 10.3 mg/dL   GFR, Estimated >41 >32 mL/min    Comment: (NOTE) Calculated using the CKD-EPI Creatinine Equation (2021)    Anion gap 10 5 - 15    Comment: Performed at [facility], [address] [place], [place] [address]  CBC with Differential     Status: Abnormal   Collection Time: 10/08/20 12:36 PM  Result Value Ref Range   WBC 10.7 (H) 4.0 - 10.5 K/uL   RBC 5.05 4.22 - 5.81 MIL/uL   Hemoglobin 13.4 13.0 - 17.0 g/dL   HCT 27.2 53.6 - 64.4 %   MCV 83.4 80.0 - 100.0 fL   MCH 26.5 26.0 - 34.0 pg   MCHC 31.8 30.0 - 36.0 g/dL   RDW 03.4 74.2 - 59.5 %   Platelets 310 150 - 400 K/uL   nRBC 0.0 0.0 - 0.2 %   Neutrophils Relative % 75 %   Neutro Abs 8.0 (H) 1.7 - 7.7 K/uL   Lymphocytes Relative 17 %   Lymphs Abs 1.8 0.7 - 4.0 K/uL   Monocytes Relative 5 %   Monocytes Absolute 0.6 0.1 - 1.0 K/uL   Eosinophils Relative 2 %   Eosinophils Absolute 0.2 0.0 - 0.5 K/uL   Basophils Relative 1 %   Basophils Absolute 0.1 0.0 - 0.1 K/uL   Immature Granulocytes 0 %   Abs Immature Granulocytes 0.03 0.00 - 0.07 K/uL    Comment:  Performed at [facility], [address] [place], [place] [address]  SARS Coronavirus 2 by RT PCR (hospital order, performed in [facility] hospital lab)     Status: None   Collection Time: 10/08/20  4:10 PM  Result Value Ref Range   SARS Coronavirus 2 NEGATIVE NEGATIVE    Comment: (NOTE) SARS-CoV-2 target nucleic acids are NOT DETECTED.  The SARS-CoV-2 RNA is generally detectable in upper and lower respiratory specimens during the acute phase of infection. The lowest concentration of SARS-CoV-2 viral copies this assay can detect is 250 copies / mL. A negative result does not preclude SARS-CoV-2 infection and should not be used as the sole basis for treatment or other patient management decisions.  A negative result may occur with improper specimen collection / handling, submission of specimen other than nasopharyngeal swab, presence of viral mutation(s) within the areas targeted by this assay, and inadequate number of viral copies (<250 copies / mL). A negative result must be combined with clinical observations, patient  history, and epidemiological information.  Fact Sheet for Patients:   BoilerBrush.com.cy  Fact Sheet for Healthcare Providers: https://pope.com/  This test is not yet approved or  cleared by the [place] FDA and has been authorized for detection and/or diagnosis of SARS-CoV-2 by FDA under an Emergency Use Authorization (EUA).  This EUA will remain in effect (meaning this test can be used) for the duration of the COVID-19 declaration under Section 564(b)(1) of the Act, 21 U.S.C. section 360bbb-3(b)(1), unless the authorization is terminated or revoked sooner.  Performed at [facility], [address] [place], [place] [address]   Comprehensive metabolic panel     Status: Abnormal   Collection Time: 10/09/20  3:35 AM  Result Value Ref Range   Sodium 136 135 - 145 mmol/L   Potassium 3.3 (L) 3.5 - 5.1 mmol/L   Chloride 104 98 - 111 mmol/L   CO2 22 22 - 32 mmol/L   Glucose, Bld 110 (H) 70 - 99 mg/dL    Comment: Glucose reference range applies only to samples taken after fasting for at least 8 hours.   BUN 8 6 - 20 mg/dL   Creatinine, Ser 6.33 0.61 - 1.24 mg/dL   Calcium 8.3 (L) 8.9 - 10.3 mg/dL   Total Protein 6.2 (L) 6.5 - 8.1 g/dL   Albumin 2.9 (L) 3.5 - 5.0 g/dL   AST 15 15 - 41 U/L   ALT 25 0 - 44 U/L   Alkaline Phosphatase 69 38 - 126 U/L   Total Bilirubin 1.4 (H) 0.3 - 1.2 mg/dL   GFR, Estimated >35 >45 mL/min    Comment: (NOTE) Calculated using the CKD-EPI Creatinine Equation (2021)    Anion gap 10 5 - 15    Comment: Performed at [facility], [address] [place], [place] [address]  CBC     Status: None   Collection Time: 10/09/20  3:35 AM  Result Value Ref Range   WBC 9.7 4.0 - 10.5 K/uL   RBC 4.88 4.22 - 5.81 MIL/uL   Hemoglobin 13.2 13.0 - 17.0 g/dL   HCT 89.3 73.4 - 28.7 %   MCV 82.0 80.0 - 100.0 fL   MCH 27.0 26.0 - 34.0 pg   MCHC 33.0 30.0 - 36.0 g/dL   RDW 68.1 15.7 - 26.2 %    Platelets 215 150 - 400 K/uL   nRBC 0.0 0.0 - 0.2 %    Comment: Performed  at [facility], [address], [place], [place] [address]  Protime-INR     Status: None   Collection Time: 10/09/20  7:55 AM  Result Value Ref Range   Prothrombin Time 13.8 11.4 - 15.2 seconds   INR 1.1 0.8 - 1.2    Comment: (NOTE) INR goal varies based on device and disease states. Performed at [facility], [address] [place], [place] [address]     CT Temporal Bones W Contrast  Result Date: 10/08/2020 CLINICAL DATA:  Left ear pain, concern for mastoiditis EXAM: CT TEMPORAL BONES WITH CONTRAST TECHNIQUE: Axial and coronal plane CT imaging of the petrous temporal bones was performed with thin-collimation image reconstruction after intravenous contrast administration. Multiplanar CT image reconstructions were also generated. CONTRAST:  61mL OMNIPAQUE IOHEXOL 300 MG/ML  SOLN COMPARISON:  None. FINDINGS: Right temporal bone: Soft tissue thickening along the cartilaginous external auditory canal with occlusion of the canal. Nonspecific contiguous opacification of the partially occluded bony external auditory canal. No erosive changes. Tympanic membrane is not well evaluated due to adjacent density. Partial opacification of the middle ear including the mesotympanum. Ossicles are unremarkable. Cochlea and semicircular canals are unremarkable. Tegmen is intact. Facial nerve demonstrates normal course. Patchy mastoid opacification. Left temporal bone: Mild soft tissue thickening along the external auditory canal. Tympanic membrane is not thickened. Middle ear cleft and ossicles are unremarkable. Cochlea and semicircular canals are unremarkable. Tegmen is intact. Facial nerve demonstrates normal course. Minor mastoid opacification. IMPRESSION: Nonspecific soft tissue thickening/opacification of the cartilaginous greater than bony right external auditory canal. No erosive changes. May reflect otitis externa and media.  No erosive changes. Nonspecific patchy right mastoid opacification. No evidence of coalescent mastoiditis. Electronically Signed   By: Guadlupe Spanish M.D.   On: 10/08/2020 13:41    ROS Blood pressure 120/76, pulse 86, temperature 98 F (36.7 C), temperature source Oral, resp. rate 18, height 5\' 11"  (1.803 m), weight (!) 224.1 kg, SpO2 97 %. Physical Exam HENT:     Head: Normocephalic.     Ears:     Comments: Awake and alert.  There is no swelling around the right ear.  He has no tenderness to any postauricular area.  His facial nerve is completely intact.  The ear canal on the right side is swollen to the point where there is just a narrow canal.  I cannot assess tympanic membrane.  Left ear has a open canal and tympanic membrane without inflammation.  There is some fluid in the middle ear on the left side.  Nose is clear.  Oral cavity/oropharynx-no lesions or swelling.    Nose: Nose normal.     Mouth/Throat:     Mouth: Mucous membranes are moist.  Eyes:     Extraocular Movements: Extraocular movements intact.     Conjunctiva/sclera: Conjunctivae normal.     Pupils: Pupils are equal, round, and reactive to light.  Musculoskeletal:     Cervical back: Normal range of motion and neck supple.  Neurological:     Mental Status: He is alert.       Assessment/Plan: Right otitis externa/otitis media-I reviewed his CT scan which does show some scattered mastoid cells but really it is not a significant mastoiditis issue.  He has right otitis externa and probably some reactive otitis media.  He does have some thickening in the middle ear but primarily his ear canal is swollen.  We talked about a wick but he does not want to have that  placed currently.  He will need to follow-up with otolaryngology if he is not better within the next 48 hours.  I would recommend Ciprodex drops as well as ciprofloxacin systemic.  He should follow-up with otolaryngology as well for his more chronic recurrent infection  issue.  It would need to be determine if this is otitis media or otitis externa that is recurrent.  Suzanna ObeyJohn Niguel Moure 10/09/2020, 9:08 AM

## 2020-10-09 NOTE — Plan of Care (Signed)

## 2020-10-09 NOTE — Discharge Summary (Signed)
Bryce Dillon ZDG:[id number] DOB: February 15, 1991 DOA: 10/08/2020  PCP: Elijio Miles., MD  Admit date: 10/08/2020  Discharge date: 10/09/2020  Admitted From: Home   Disposition:  Home   Recommendations for Outpatient Follow-up:   Follow up with PCP in 1-2 weeks  PCP Please obtain BMP/CBC, 2 view CXR in 1week,  (see Discharge instructions)   PCP Please follow up on the following pending results:    Home Health: None   Equipment/Devices: None  Consultations: ENT Discharge Condition: Stable    CODE STATUS: Full    Diet Recommendation: Heart Healthy   Diet Order            Diet 2 gram sodium Room service appropriate? Yes; Fluid consistency: Thin  Diet effective now           Diet - low sodium heart healthy                  Chief Complaint  Patient presents with  . Otalgia  . Recurrent Skin Infections     Brief history of present illness from the day of admission and additional interim summary    Bryce Dillon is a 30 y.o. male with medical history significant of anxiety with depression, morbid obesity who went to urgent care for right ear pain, was diagnosed with possible right-sided mastoiditis and admitted to the hospital                                                                 Hospital Course    1.  Right ear mastoiditis and otitis media.  Seen by ENT, per ENT very mild infection, total 10 days of oral antibiotics and eardrops, discussed with Dr. Jearld Fenton recommended to use Cipro drops and orally.  Post discharge follow with ENT and PCP in a week.  2.  Morbid obesity.  Outpatient follow-up with PCP.  3.  Depression Home medications continued.  4.  Jock itch.  Follow with PCP continue home regimen.   Discharge diagnosis     Principal Problem:   Mastoiditis of right side Active Problems:    Morbid obesity (HCC)   Anxiety associated with depression    Discharge instructions    Discharge Instructions    Diet - low sodium heart healthy   Complete by: As directed    Discharge instructions   Complete by: As directed    Keep both ears clean and dry at all times.    Follow with Primary MD Elijio Miles., MD in 7 days   Get CBC, CMP  checked next visit within 1 week by Primary MD   Activity: As tolerated with Full fall precautions use walker/cane & assistance as needed  Disposition Home    Diet: Heart Healthy     Special Instructions: If you have  smoked or chewed Tobacco  in the last 2 yrs please stop smoking, stop any regular Alcohol  and or any Recreational drug use.  On your next visit with your primary care physician please Get Medicines reviewed and adjusted.  Please request your Prim.MD to go over all Hospital Tests and Procedure/Radiological results at the follow up, please get all Hospital records sent to your Prim MD by signing hospital release before you go home.  If you experience worsening of your admission symptoms, develop shortness of breath, life threatening emergency, suicidal or homicidal thoughts you must seek medical attention immediately by calling 911 or calling your MD immediately  if symptoms less severe.  You Must read complete instructions/literature along with all the possible adverse reactions/side effects for all the Medicines you take and that have been prescribed to you. Take any new Medicines after you have completely understood and accpet all the possible adverse reactions/side effects.   Increase activity slowly   Complete by: As directed       Discharge Medications   Allergies as of 10/09/2020   No Known Allergies     Medication List    TAKE these medications   acetaminophen 500 MG tablet Commonly known as: TYLENOL Take 1 tablet (500 mg total) by mouth every 8 (eight) hours as needed for moderate pain.   ciprofloxacin 500 MG  tablet Commonly known as: Cipro Take 1 tablet (500 mg total) by mouth 2 (two) times daily for 18 doses.   ciprofloxacin-dexamethasone OTIC suspension Commonly known as: CIPRODEX Place 4 drops into the right ear 2 (two) times daily.   diphenhydrAMINE 25 MG tablet Commonly known as: BENADRYL Take 25 mg by mouth at bedtime as needed for sleep.   Ingrezza 40 & 80 MG Cppk Generic drug: Valbenazine Tosylate Take 40 mg by mouth at bedtime for 7 days, THEN 80 mg at bedtime for 7 days. Start taking on: March 21, 2020   Ingrezza 80 MG Caps Generic drug: Valbenazine Tosylate Take 1 capsule by mouth at bedtime.   lamoTRIgine 25 MG tablet Commonly known as: LaMICtal Take 1 tablet (25 mg total) by mouth daily for 14 days, THEN 2 tablets (50 mg total) daily for 14 days. Start taking on: March 21, 2020   lithium carbonate 150 MG capsule Take 3 capsules (450 mg total) by mouth at bedtime.   Melatonin 5 MG Caps Take by mouth.   sertraline 100 MG tablet Commonly known as: ZOLOFT Take 2 tablets (200 mg total) by mouth daily.   traZODone 50 MG tablet Commonly known as: DESYREL TAKE 1 TABLET BY MOUTH EVERYDAY AT BEDTIME        Follow-up Information    Elijio Miles., MD. Schedule an appointment as soon as possible for a visit in 1 week(s).   Specialty: Family Medicine Contact information: [address] [place] [address] [phone]        Suzanna Obey, MD. Schedule an appointment as soon as possible for a visit in 1 week(s).   Specialty: Otolaryngology Contact information: [address] [place] [address] [phone]               Major procedures and Radiology Reports - PLEASE review detailed and final reports thoroughly  -       CT Temporal Bones W Contrast  Result Date: 10/08/2020 CLINICAL DATA:  Left ear pain, concern for mastoiditis EXAM: CT TEMPORAL BONES WITH CONTRAST TECHNIQUE: Axial and coronal plane CT imaging of the petrous temporal  bones was  performed with thin-collimation image reconstruction after intravenous contrast administration. Multiplanar CT image reconstructions were also generated. CONTRAST:  70mL OMNIPAQUE IOHEXOL 300 MG/ML  SOLN COMPARISON:  None. FINDINGS: Right temporal bone: Soft tissue thickening along the cartilaginous external auditory canal with occlusion of the canal. Nonspecific contiguous opacification of the partially occluded bony external auditory canal. No erosive changes. Tympanic membrane is not well evaluated due to adjacent density. Partial opacification of the middle ear including the mesotympanum. Ossicles are unremarkable. Cochlea and semicircular canals are unremarkable. Tegmen is intact. Facial nerve demonstrates normal course. Patchy mastoid opacification. Left temporal bone: Mild soft tissue thickening along the external auditory canal. Tympanic membrane is not thickened. Middle ear cleft and ossicles are unremarkable. Cochlea and semicircular canals are unremarkable. Tegmen is intact. Facial nerve demonstrates normal course. Minor mastoid opacification. IMPRESSION: Nonspecific soft tissue thickening/opacification of the cartilaginous greater than bony right external auditory canal. No erosive changes. May reflect otitis externa and media. No erosive changes. Nonspecific patchy right mastoid opacification. No evidence of coalescent mastoiditis. Electronically Signed   By: Guadlupe Spanish M.D.   On: 10/08/2020 13:41    Micro Results    Recent Results (from the past 240 hour(s))  SARS Coronavirus 2 by RT PCR (hospital order, performed in [facility] hospital lab)     Status: None   Collection Time: 10/08/20  4:10 PM  Result Value Ref Range Status   SARS Coronavirus 2 NEGATIVE NEGATIVE Final    Comment: (NOTE) SARS-CoV-2 target nucleic acids are NOT DETECTED.  The SARS-CoV-2 RNA is generally detectable in upper and lower respiratory specimens during the acute phase of infection. The lowest concentration  of SARS-CoV-2 viral copies this assay can detect is 250 copies / mL. A negative result does not preclude SARS-CoV-2 infection and should not be used as the sole basis for treatment or other patient management decisions.  A negative result may occur with improper specimen collection / handling, submission of specimen other than nasopharyngeal swab, presence of viral mutation(s) within the areas targeted by this assay, and inadequate number of viral copies (<250 copies / mL). A negative result must be combined with clinical observations, patient history, and epidemiological information.  Fact Sheet for Patients:   BoilerBrush.com.cy  Fact Sheet for Healthcare Providers: https://pope.com/  This test is not yet approved or  cleared by the [place] FDA and has been authorized for detection and/or diagnosis of SARS-CoV-2 by FDA under an Emergency Use Authorization (EUA).  This EUA will remain in effect (meaning this test can be used) for the duration of the COVID-19 declaration under Section 564(b)(1) of the Act, 21 U.S.C. section 360bbb-3(b)(1), unless the authorization is terminated or revoked sooner.  Performed at [facility] [address] [place], [place] [address]     Today   Subjective    Bryce Dillon today has no headache,no chest abdominal pain,no new weakness tingling or numbness, feels much better wants to go home today.    Objective   Blood pressure 120/76, pulse 86, temperature 98 F (36.7 C), temperature source Oral, resp. rate 18, height 5\' 11"  (1.803 m), weight (!) 224.1 kg, SpO2 97 %.   Intake/Output Summary (Last 24 hours) at 10/09/2020 1032 Last data filed at 10/09/2020 0600 Gross per 24 hour  Intake 736.75 ml  Output 0 ml  Net 736.75 ml    Exam  Awake Alert, No new F.N deficits, Normal affect [place].AT,PERRAL, mild R. Mastoid tenderness Supple Neck,No JVD, No  cervical lymphadenopathy  appriciated.  Symmetrical Chest wall movement, Good air movement bilaterally, CTAB RRR,No Gallops,Rubs or new Murmurs, No Parasternal Heave +ve B.Sounds, Abd Soft, Non tender, No organomegaly appriciated, No rebound -guarding or rigidity. No Cyanosis, Clubbing or edema, No new Rash or bruise   Data Review   CBC w Diff:  Lab Results  Component Value Date   WBC 9.7 10/09/2020   HGB 13.2 10/09/2020   HCT 40.0 10/09/2020   PLT 215 10/09/2020   LYMPHOPCT 17 10/08/2020   MONOPCT 5 10/08/2020   EOSPCT 2 10/08/2020   BASOPCT 1 10/08/2020    CMP:  Lab Results  Component Value Date   NA 136 10/09/2020   K 3.3 (L) 10/09/2020   CL 104 10/09/2020   CO2 22 10/09/2020   BUN 8 10/09/2020   CREATININE 0.90 10/09/2020   PROT 6.2 (L) 10/09/2020   ALBUMIN 2.9 (L) 10/09/2020   BILITOT 1.4 (H) 10/09/2020   ALKPHOS 69 10/09/2020   AST 15 10/09/2020   ALT 25 10/09/2020  .   Total Time in preparing paper work, data evaluation and todays exam - 35 minutes  Susa RaringPrashant Singh M.D on 10/09/2020 at 10:32 AM  [facility]

## 2020-10-09 NOTE — Discharge Instructions (Signed)
Keep both ears clean and dry at all times.    Follow with Primary MD Elijio Miles., MD in 7 days   Get CBC, CMP  checked next visit within 1 week by Primary MD   Activity: As tolerated with Full fall precautions use walker/cane & assistance as needed  Disposition Home    Diet: Heart Healthy     Special Instructions: If you have smoked or chewed Tobacco  in the last 2 yrs please stop smoking, stop any regular Alcohol  and or any Recreational drug use.  On your next visit with your primary care physician please Get Medicines reviewed and adjusted.  Please request your Prim.MD to go over all Hospital Tests and Procedure/Radiological results at the follow up, please get all Hospital records sent to your Prim MD by signing hospital release before you go home.  If you experience worsening of your admission symptoms, develop shortness of breath, life threatening emergency, suicidal or homicidal thoughts you must seek medical attention immediately by calling 911 or calling your MD immediately  if symptoms less severe.  You Must read complete instructions/literature along with all the possible adverse reactions/side effects for all the Medicines you take and that have been prescribed to you. Take any new Medicines after you have completely understood and accpet all the possible adverse reactions/side effects.

## 2020-10-20 ENCOUNTER — Encounter: Payer: Self-pay | Admitting: Psychiatry

## 2020-10-20 ENCOUNTER — Other Ambulatory Visit: Payer: Self-pay

## 2020-10-20 ENCOUNTER — Ambulatory Visit (INDEPENDENT_AMBULATORY_CARE_PROVIDER_SITE_OTHER): Payer: No Typology Code available for payment source | Admitting: Psychiatry

## 2020-10-20 DIAGNOSIS — F331 Major depressive disorder, recurrent, moderate: Secondary | ICD-10-CM

## 2020-10-20 DIAGNOSIS — F32A Depression, unspecified: Secondary | ICD-10-CM | POA: Diagnosis not present

## 2020-10-20 DIAGNOSIS — F411 Generalized anxiety disorder: Secondary | ICD-10-CM

## 2020-10-20 DIAGNOSIS — G4730 Sleep apnea, unspecified: Secondary | ICD-10-CM | POA: Diagnosis not present

## 2020-10-20 DIAGNOSIS — G4700 Insomnia, unspecified: Secondary | ICD-10-CM

## 2020-10-20 DIAGNOSIS — Z79899 Other long term (current) drug therapy: Secondary | ICD-10-CM

## 2020-10-20 MED ORDER — SERTRALINE HCL 100 MG PO TABS: 200.0000 mg | ORAL_TABLET | Freq: Every day | ORAL | 1 refills | Status: DC

## 2020-10-20 MED ORDER — TRAZODONE HCL 50 MG PO TABS: ORAL_TABLET | ORAL | 1 refills | Status: DC

## 2020-10-20 MED ORDER — MODAFINIL 200 MG PO TABS: 200.0000 mg | ORAL_TABLET | Freq: Every day | ORAL | 2 refills | Status: DC

## 2020-10-20 NOTE — Progress Notes (Signed)
   10/20/20 0931  Facial and Oral Movements  Muscles of Facial Expression 0  Lips and Perioral Area 0  Jaw 0  Tongue 0  Extremity Movements  Upper (arms, wrists, hands, fingers) 0  Lower (legs, knees, ankles, toes) 0  Trunk Movements  Neck, shoulders, hips 0  Overall Severity  Severity of abnormal movements (highest score from questions above) 0  Incapacitation due to abnormal movements 0  Patient's awareness of abnormal movements (rate only patient's report) 1  AIMS Total Score  AIMS Total Score 1

## 2020-10-20 NOTE — Progress Notes (Signed)
Bryce Dillon [id number] May 10, 1991 30 y.o.  Subjective:   Patient ID:  Bryce Dillon is a 30 y.o. (DOB 1991/02/11) male.  Chief Complaint:  Chief Complaint  Patient presents with  . Depression  . Anxiety  . Sleeping Problem    HPI Bryce Dillon presents to the office today for follow-up of anxiety, depression, TD, and sleep disturbance. He is accompanied by his mother.   He reports, "anxiety has been ok." He has anxiety about his job and finances. Denies panic s/s. He reports that he has not been in many social situations and has some anxiety thinking about being in social situations. Has anxiety about being around friends he sees weekly.  He has been noticing more depression recently, particularly in the evening. He reports that he tries to keep himself occupied during the day.   He is experiencing some increased depression. He reports that he is not experiencing persistent depression. Estimates sleeping 6-7 hours. He reports occasional nightmares and dreams can be more vivid with Trazodone. Sometimes fearful about going to sleep. Wears cPap. Notices some irritability. He reports that appetite has been fair. Has been drinking more herbal tea. He reports that his motivation and energy have been low and he attributes this to depression. He reports difficulty with concentration and focus with work. Mother notices that he has been distracted with work. Enjoys playing Facilities manager. Denies SI.   He reports that TD has not been as bad. He reports that Ingrezza did not seem to be helpful.   He reports that work is going ok. He reports that work is transitioning back to campus and is anxious about this. He reports that he would likely be ask to do blended schedule of remote and in person.   Lost dog of 20 years.   Continues to see Monique Crutchfield, LCSW.   Past Psychiatric Medication Trials: Rexulti- Has had tongue movements for several months. Some decrease in movements with addition of Gingko.  Unsure of benefits. Abilify Cymbalta Lexapro Sertraline- Helpful for anxiety Wellbutrin-racing thoughts Buspar- Side effects (HA's, tired) Lithium-Caused tremor Lamictal-unsure of benefits Geodon Modafanil- unsure of benefit. Trazodone Xanax  AIMS   Flowsheet Row Office Visit from 10/20/2020 in [facility]  AIMS Total Score 1    PHQ2-9   Flowsheet Row Video Visit from 03/21/2020 in [facility]  PHQ-2 Total Score 6  PHQ-9 Total Score 21    Flowsheet Row ED to Hosp-Admission (Discharged) from 10/08/2020 in [facility]  C-SSRS RISK CATEGORY No Risk       Review of Systems:  Review of Systems  HENT: Positive for ear pain.        Has had several ear infections in the last few months. Sees ENT today  Musculoskeletal: Negative for gait problem.  Skin:       Fungal infection  Psychiatric/Behavioral:       Please refer to HPI    Medications: I have reviewed the patient's current medications.  Current Outpatient Medications  Medication Sig Dispense Refill  . acetaminophen (TYLENOL) 500 MG tablet Take 1 tablet (500 mg total) by mouth every 8 (eight) hours as needed for moderate pain. 20 tablet 0  . Ciprofloxacin (CIPRO PO) Take by mouth.    . ciprofloxacin-dexamethasone (CIPRODEX) OTIC suspension Place 4 drops into the right ear 2 (two) times daily. 7.5 mL 0  . diphenhydrAMINE (BENADRYL) 25 MG tablet Take 25 mg by mouth at bedtime as needed for sleep.    Marland Kitchen  Melatonin 5 MG CAPS Take by mouth.    Marland Kitchen omeprazole (PRILOSEC) 20 MG capsule Take 20 mg by mouth daily as needed.    . modafinil (PROVIGIL) 200 MG tablet Take 1 tablet (200 mg total) by mouth daily. 30 tablet 2  . sertraline (ZOLOFT) 100 MG tablet Take 2 tablets (200 mg total) by mouth daily. 180 tablet 1  . traZODone (DESYREL) 50 MG tablet TAKE 1 TABLET BY MOUTH EVERYDAY AT BEDTIME 90 tablet 1   No current facility-administered medications for this visit.     Medication Side Effects: None  Allergies: No Known Allergies  Past Medical History:  Diagnosis Date  . Anxiety   . Depression     Family History  Problem Relation Age of Onset  . Alcohol abuse Father     Social History   Socioeconomic History  . Marital status: Single    Spouse name: Not on file  . Number of children: Not on file  . Years of education: Not on file  . Highest education level: Not on file  Occupational History  . Occupation: IT/Fund Raising  Tobacco Use  . Smoking status: Never Smoker  . Smokeless tobacco: Never Used  Vaping Use  . Vaping Use: Never used  Substance and Sexual Activity  . Alcohol use: No  . Drug use: No  . Sexual activity: Never  Other Topics Concern  . Not on file  Social History Narrative  . Not on file   Social Determinants of Health   Financial Resource Strain: Not on file  Food Insecurity: Not on file  Transportation Needs: Not on file  Physical Activity: Not on file  Stress: Not on file  Social Connections: Not on file  Intimate Partner Violence: Not on file    Past Medical History, Surgical history, Social history, and Family history were reviewed and updated as appropriate.   Please see review of systems for further details on the patient's review from today.   Objective:   Physical Exam:  There were no vitals taken for this visit.  Physical Exam  Lab Review:     Component Value Date/Time   NA 136 10/09/2020 0335   K 3.3 (L) 10/09/2020 0335   CL 104 10/09/2020 0335   CO2 22 10/09/2020 0335   GLUCOSE 110 (H) 10/09/2020 0335   BUN 8 10/09/2020 0335   CREATININE 0.90 10/09/2020 0335   CALCIUM 8.3 (L) 10/09/2020 0335   PROT 6.2 (L) 10/09/2020 0335   ALBUMIN 2.9 (L) 10/09/2020 0335   AST 15 10/09/2020 0335   ALT 25 10/09/2020 0335   ALKPHOS 69 10/09/2020 0335   BILITOT 1.4 (H) 10/09/2020 0335   GFRNONAA >60 10/09/2020 0335       Component Value Date/Time   WBC 9.7 10/09/2020 0335   RBC 4.88  10/09/2020 0335   HGB 13.2 10/09/2020 0335   HCT 40.0 10/09/2020 0335   PLT 215 10/09/2020 0335   MCV 82.0 10/09/2020 0335   MCH 27.0 10/09/2020 0335   MCHC 33.0 10/09/2020 0335   RDW 15.5 10/09/2020 0335   LYMPHSABS 1.8 10/08/2020 1236   MONOABS 0.6 10/08/2020 1236   EOSABS 0.2 10/08/2020 1236   BASOSABS 0.1 10/08/2020 1236    No results found for: POCLITH, LITHIUM   No results found for: PHENYTOIN, PHENOBARB, VALPROATE, CBMZ   .res Assessment: Plan:   Pt seen for 30 minutes and time spent discussing possible treatment options, to include discussing potential benefits, risks, and side effects of modafinil.  Discussed that modafinil is approved for sleep apnea and may be helpful since patient reports continued difficulty with fatigue and concentration despite use of CPAP.  Discussed that modafinil may be beneficial for energy, motivation, and concentration.  Patient agrees to trial of modafinil.  We will start modafinil 200 mg daily for sleep apnea and off label indications. Discussed obtaining hemoglobin A1c and a lipid panel since patient's primary care provider retired and patient has had some delays with establishing care with new PCP.  Discussed that recent recurrent yeast/fungal infections could be possible indication of diabetes/elevated glucose levels.  Discussed that atypical antipsychotics that he has taken in the past can cause or contribute to increased blood sugar and cholesterol.  Patient agrees to obtain labs.  Will order hemoglobin A1c and lipid panel. Continue sertraline 200 mg daily for anxiety and depression. Continue trazodone 50 mg at bedtime for insomnia. Recommended that patient talk with his supervisor about concerns with increased anxiety with returning to the office to discuss if it is possible for him to continue to work mostly from home. Recommend continuing psychotherapy. Patient to follow-up with this provider in 3 months or sooner if clinically  indicated. Patient advised to contact office with any questions, adverse effects, or acute worsening in signs and symptoms.    Remon was seen today for depression, anxiety and sleeping problem.  Diagnoses and all orders for this visit:  High risk medication use -     Hemoglobin A1c -     Lipid panel  Moderate episode of recurrent major depressive disorder (HCC) -     modafinil (PROVIGIL) 200 MG tablet; Take 1 tablet (200 mg total) by mouth daily.  Sleep apnea, unspecified type -     modafinil (PROVIGIL) 200 MG tablet; Take 1 tablet (200 mg total) by mouth daily.  Depression, unspecified depression type -     sertraline (ZOLOFT) 100 MG tablet; Take 2 tablets (200 mg total) by mouth daily.  Generalized anxiety disorder -     sertraline (ZOLOFT) 100 MG tablet; Take 2 tablets (200 mg total) by mouth daily.  Insomnia, unspecified type -     traZODone (DESYREL) 50 MG tablet; TAKE 1 TABLET BY MOUTH EVERYDAY AT BEDTIME     Please see After Visit Summary for patient specific instructions.  Future Appointments  Date Time Provider Department Center  01/20/2021  9:30 AM Corie Chiquito, PMHNP [facility] None    Orders Placed This Encounter  Procedures  . Hemoglobin A1c  . Lipid panel    -------------------------------

## 2020-10-21 ENCOUNTER — Telehealth: Payer: Self-pay

## 2020-10-21 NOTE — Telephone Encounter (Signed)
Prior approval received for MODAFINIL 200 MG effective 10/21/2020-04/23/2021, [facility] PA# [id number]

## 2021-01-20 ENCOUNTER — Ambulatory Visit: Payer: No Typology Code available for payment source | Admitting: Psychiatry

## 2021-04-06 ENCOUNTER — Other Ambulatory Visit: Payer: Self-pay | Admitting: Psychiatry

## 2021-04-06 DIAGNOSIS — F331 Major depressive disorder, recurrent, moderate: Secondary | ICD-10-CM

## 2021-04-06 DIAGNOSIS — G4730 Sleep apnea, unspecified: Secondary | ICD-10-CM

## 2021-04-06 NOTE — Telephone Encounter (Signed)
Please schedule appt

## 2021-04-07 NOTE — Telephone Encounter (Signed)
Last filled 7/21 app 9/21

## 2021-04-07 NOTE — Telephone Encounter (Signed)
Pt has an appt 9/21 

## 2021-04-21 ENCOUNTER — Other Ambulatory Visit: Payer: Self-pay | Admitting: Psychiatry

## 2021-04-21 DIAGNOSIS — F32A Depression, unspecified: Secondary | ICD-10-CM

## 2021-04-21 DIAGNOSIS — F411 Generalized anxiety disorder: Secondary | ICD-10-CM

## 2021-04-26 ENCOUNTER — Telehealth (INDEPENDENT_AMBULATORY_CARE_PROVIDER_SITE_OTHER): Payer: No Typology Code available for payment source | Admitting: Psychiatry

## 2021-04-26 ENCOUNTER — Encounter: Payer: Self-pay | Admitting: Psychiatry

## 2021-04-26 ENCOUNTER — Other Ambulatory Visit: Payer: Self-pay

## 2021-04-26 DIAGNOSIS — F4010 Social phobia, unspecified: Secondary | ICD-10-CM

## 2021-04-26 DIAGNOSIS — F331 Major depressive disorder, recurrent, moderate: Secondary | ICD-10-CM

## 2021-04-26 DIAGNOSIS — F3341 Major depressive disorder, recurrent, in partial remission: Secondary | ICD-10-CM | POA: Diagnosis not present

## 2021-04-26 DIAGNOSIS — F32A Depression, unspecified: Secondary | ICD-10-CM

## 2021-04-26 DIAGNOSIS — G4700 Insomnia, unspecified: Secondary | ICD-10-CM

## 2021-04-26 DIAGNOSIS — G4730 Sleep apnea, unspecified: Secondary | ICD-10-CM

## 2021-04-26 DIAGNOSIS — F411 Generalized anxiety disorder: Secondary | ICD-10-CM

## 2021-04-26 MED ORDER — MODAFINIL 200 MG PO TABS: ORAL_TABLET | ORAL | 5 refills | Status: DC

## 2021-04-26 MED ORDER — SERTRALINE HCL 100 MG PO TABS
200.0000 mg | ORAL_TABLET | Freq: Every day | ORAL | 1 refills | Status: DC
Start: 2021-04-26 — End: 2021-12-04

## 2021-04-26 MED ORDER — TRAZODONE HCL 50 MG PO TABS: ORAL_TABLET | ORAL | 1 refills | Status: DC

## 2021-04-26 MED ORDER — PROPRANOLOL HCL 10 MG PO TABS: ORAL_TABLET | ORAL | 1 refills | Status: DC

## 2021-04-26 NOTE — Progress Notes (Signed)
Bryce Dillon [id number] 22-Dec-1990 30 y.o.  Virtual Visit via Video Note  I connected with pt @ on 04/26/21 at  9:30 AM EDT by a video enabled telemedicine application and verified that I am speaking with the correct person using two identifiers.   I discussed the limitations of evaluation and management by telemedicine and the availability of in person appointments. The patient expressed understanding and agreed to proceed.  I discussed the assessment and treatment plan with the patient. The patient was provided an opportunity to ask questions and all were answered. The patient agreed with the plan and demonstrated an understanding of the instructions.   The patient was advised to call back or seek an in-person evaluation if the symptoms worsen or if the condition fails to improve as anticipated.  I provided 30 minutes of non-face-to-face time during this encounter.  The patient was located at home.  The provider was located at [facility].   Corie Chiquito, PMHNP   Subjective:   Patient ID:  Bryce Dillon is a 30 y.o. (DOB [birth year]) male.  Chief Complaint:  Chief Complaint  Patient presents with   Anxiety   Depression    HPI Dedrick Heffner presents for follow-up of anxiety, depression, and sleep disturbance. He reports that he had recent COVID exposure and requested visit be changed from in person to video.   Denies any concerns. He reports that his mood has been "ok." He reports that his office is now requiring he come to the office 3 days a week. He has been working fully remote. He reports some anticipator anxiety about returning to the office. Denies any panic attacks. He reports continued worry. Some mild social anxiety. Sleeping ok. He reports that he has lost some weight. He reports that he lost his appetite when anxiety was high and had some gaggin and this has improved. He reports adequate food intake. Motivation has been low. Energy has also been low. He reports that  Armodafinil helps some for a brief duration and then effect diminishes around lunch time. He reports poor concentration and goes from one task to another. He reports some feelings of hopelessness. Denies SI.   He went to a convention this past weekend. He reports that he is not leaving the house regularly.   He continues to see therapist.   He works in Consulting civil engineer for [employer]. Mother started a job at [employer] and is working nights. Brother built an apartment in the garage. He reports that it has "been a little more lonely at the house." 82 yo aunt lives with them and he is now taking care of her in the evenings.   Past Psychiatric Medication Trials: Rexulti-  Has had tongue movements for several months. Some decrease in movements with addition of Gingko. Unsure of benefits.  Abilify Cymbalta Lexapro Sertraline- Helpful for anxiety Wellbutrin-racing thoughts Buspar- Side effects (HA's, tired) Lithium-Caused tremor Lamictal-unsure of benefits Geodon Modafanil- unsure of benefit. Trazodone Xanax    Review of Systems:  Review of Systems  Cardiovascular:  Negative for palpitations.  Musculoskeletal:  Negative for gait problem.  Neurological:  Negative for tremors and headaches.  Psychiatric/Behavioral:         Please refer to HPI   Medications: I have reviewed the patient's current medications.  Current Outpatient Medications  Medication Sig Dispense Refill   acetaminophen (TYLENOL) 500 MG tablet Take 1 tablet (500 mg total) by mouth every 8 (eight) hours as needed for moderate pain. 20 tablet 0  diphenhydrAMINE (BENADRYL) 25 MG tablet Take 25 mg by mouth at bedtime as needed for sleep.     Melatonin 5 MG CAPS Take by mouth.     omeprazole (PRILOSEC) 20 MG capsule Take 20 mg by mouth daily as needed.     propranolol (INDERAL) 10 MG tablet Take 1-2 tabs po BID prn anxiety 120 tablet 1   modafinil (PROVIGIL) 200 MG tablet Take 1 tablet (200 mg total) by mouth every morning  AND 0.5 tablets (100 mg total) daily at 12 noon. 45 tablet 5   sertraline (ZOLOFT) 100 MG tablet Take 2 tablets (200 mg total) by mouth daily. 180 tablet 1   traZODone (DESYREL) 50 MG tablet TAKE 1 TABLET BY MOUTH EVERYDAY AT BEDTIME 90 tablet 1   No current facility-administered medications for this visit.    Medication Side Effects: Other: Occ jitteriness  Allergies: No Known Allergies  Past Medical History:  Diagnosis Date   Anxiety    Depression     Family History  Problem Relation Age of Onset   Alcohol abuse Father     Social History   Socioeconomic History   Marital status: Single    Spouse name: Not on file   Number of children: Not on file   Years of education: Not on file   Highest education level: Not on file  Occupational History   Occupation: IT/Fund Raising  Tobacco Use   Smoking status: Never   Smokeless tobacco: Never  Vaping Use   Vaping Use: Never used  Substance and Sexual Activity   Alcohol use: No   Drug use: No   Sexual activity: Never  Other Topics Concern   Not on file  Social History Narrative   Not on file   Social Determinants of Health   Financial Resource Strain: Not on file  Food Insecurity: Not on file  Transportation Needs: Not on file  Physical Activity: Not on file  Stress: Not on file  Social Connections: Not on file  Intimate Partner Violence: Not on file    Past Medical History, Surgical history, Social history, and Family history were reviewed and updated as appropriate.   Please see review of systems for further details on the patient's review from today.   Objective:   Physical Exam:  There were no vitals taken for this visit.  Physical Exam Neurological:     Mental Status: He is alert and oriented to person, place, and time.     Cranial Nerves: No dysarthria.  Psychiatric:        Attention and Perception: Attention and perception normal.        Speech: Speech normal.        Behavior: Behavior is  cooperative.        Thought Content: Thought content normal. Thought content is not paranoid or delusional. Thought content does not include homicidal or suicidal ideation. Thought content does not include homicidal or suicidal plan.        Cognition and Memory: Cognition and memory normal.        Judgment: Judgment normal.     Comments: Insight intact Mood presents as mildly anxious    Lab Review:     Component Value Date/Time   NA 136 10/09/2020 0335   K 3.3 (L) 10/09/2020 0335   CL 104 10/09/2020 0335   CO2 22 10/09/2020 0335   GLUCOSE 110 (H) 10/09/2020 0335   BUN 8 10/09/2020 0335   CREATININE 0.90 10/09/2020 0335   CALCIUM 8.3 (  L) 10/09/2020 0335   PROT 6.2 (L) 10/09/2020 0335   ALBUMIN 2.9 (L) 10/09/2020 0335   AST 15 10/09/2020 0335   ALT 25 10/09/2020 0335   ALKPHOS 69 10/09/2020 0335   BILITOT 1.4 (H) 10/09/2020 0335   GFRNONAA >60 10/09/2020 0335       Component Value Date/Time   WBC 9.7 10/09/2020 0335   RBC 4.88 10/09/2020 0335   HGB 13.2 10/09/2020 0335   HCT 40.0 10/09/2020 0335   PLT 215 10/09/2020 0335   MCV 82.0 10/09/2020 0335   MCH 27.0 10/09/2020 0335   MCHC 33.0 10/09/2020 0335   RDW 15.5 10/09/2020 0335   LYMPHSABS 1.8 10/08/2020 1236   MONOABS 0.6 10/08/2020 1236   EOSABS 0.2 10/08/2020 1236   BASOSABS 0.1 10/08/2020 1236    No results found for: POCLITH, LITHIUM   No results found for: PHENYTOIN, PHENOBARB, VALPROATE, CBMZ   .res Assessment: Plan:   Pt seen for 30 minutes and time spent discussing option of adding low dose of Modafinil mid-day to improve energy, motivation, and concentration in the afternoon since he reports that Modafinil is effective in the morning and then is no longer effective. Pt agrees to increase. Will change Modafinil to 200 mg po q am and 100 mg po mid-day.  Discussed potential benefits, risks, and side effects of Propranolol. Discussed that Propranolol can be used as needed for anxiety signs and symptoms. Patient  agrees to trial of Propranolol. Will start Propranolol 10 mg 1-2 tabs po BID prn anxiety.   Continue sertraline 200 mg daily function. Continue trazodone 50 mg daily for insomnia. Recommend continuing psychotherapy. Patient to follow-up in 6 months or sooner if clinically indicated. Patient advised to contact office with any questions, adverse effects, or acute worsening in signs and symptoms.    Bryce Dillon was seen today for anxiety and depression.  Diagnoses and all orders for this visit:  Social anxiety disorder -     propranolol (INDERAL) 10 MG tablet; Take 1-2 tabs po BID prn anxiety  Recurrent major depressive disorder, in partial remission (HCC) -     modafinil (PROVIGIL) 200 MG tablet; Take 1 tablet (200 mg total) by mouth every morning AND 0.5 tablets (100 mg total) daily at 12 noon.  Sleep apnea, unspecified type -     modafinil (PROVIGIL) 200 MG tablet; Take 1 tablet (200 mg total) by mouth every morning AND 0.5 tablets (100 mg total) daily at 12 noon.  Depression, unspecified depression type -     sertraline (ZOLOFT) 100 MG tablet; Take 2 tablets (200 mg total) by mouth daily.  Generalized anxiety disorder -     sertraline (ZOLOFT) 100 MG tablet; Take 2 tablets (200 mg total) by mouth daily.  Insomnia, unspecified type -     traZODone (DESYREL) 50 MG tablet; TAKE 1 TABLET BY MOUTH EVERYDAY AT BEDTIME    Please see After Visit Summary for patient specific instructions.  No future appointments.  No orders of the defined types were placed in this encounter.     -------------------------------

## 2021-05-09 ENCOUNTER — Telehealth: Payer: Self-pay

## 2021-05-09 NOTE — Telephone Encounter (Signed)
Prior Authorization submitted and approved for MODAFINIL 200 MG 1.5 TABS effective 05/02/2021-10/30/2021, PA# [id number]

## 2021-05-25 ENCOUNTER — Other Ambulatory Visit: Payer: Self-pay | Admitting: Psychiatry

## 2021-05-25 DIAGNOSIS — F4010 Social phobia, unspecified: Secondary | ICD-10-CM

## 2021-05-26 ENCOUNTER — Other Ambulatory Visit: Payer: Self-pay

## 2021-07-15 IMAGING — CT CT TEMPORAL BONES W/ CM
2 of 6 series · 13 of 40 positions shown, 16 images · IV contrast (omnipaque)
Comparison: None.

CLINICAL DATA: Left ear pain, concern for mastoiditis

EXAM:
CT TEMPORAL BONES WITH CONTRAST
TECHNIQUE: Axial and coronal plane CT imaging of the petrous temporal bones was
performed with thin-collimation image reconstruction after
intravenous contrast administration. Multiplanar CT image
reconstructions were also generated.
CONTRAST:  75mL OMNIPAQUE IOHEXOL 300 MG/ML  SOLN

[Series 4: coronal bone · coronal · 0.16mm/px · 2 of 298 slices shown]
[im 100/298  bone]
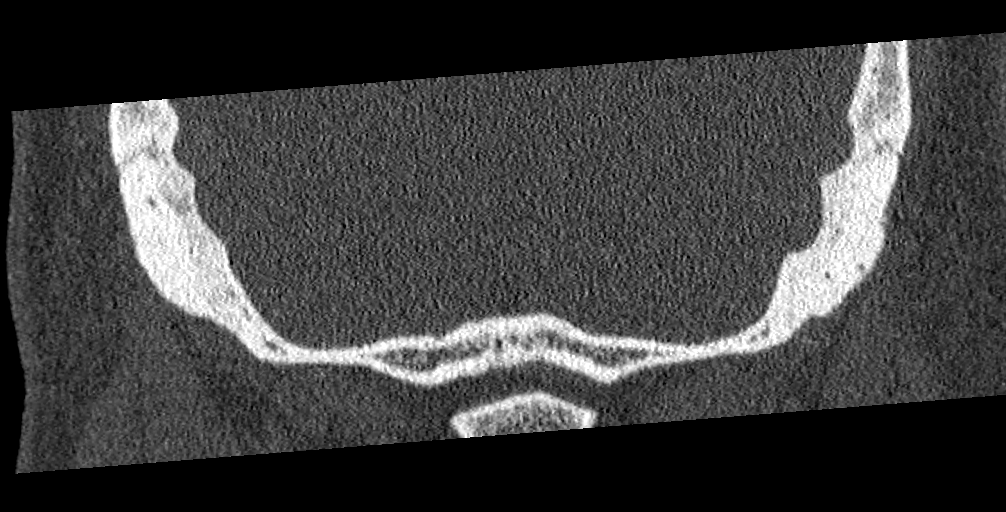
[im 199/298  bone]
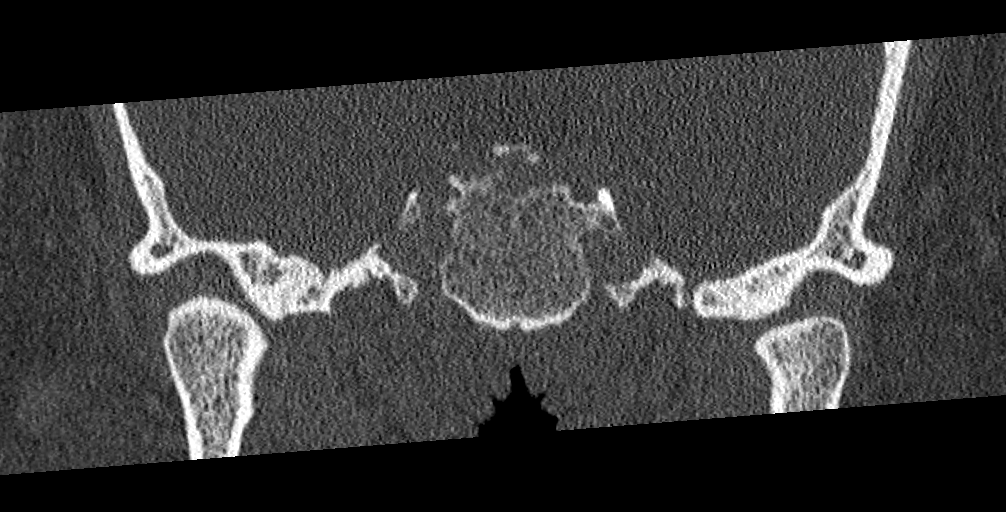

[Series 5: ax mag right · axial · 0.20mm/px · z∈[+916,+957]mm · 11 of 83 slices shown, 14 images]
[im 7/83  brain]
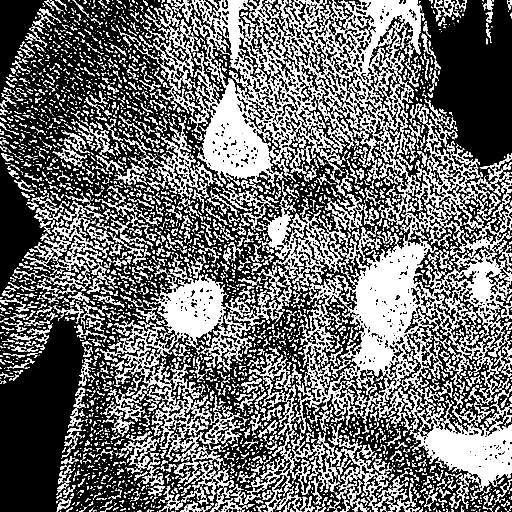
[im 7/83  bone]
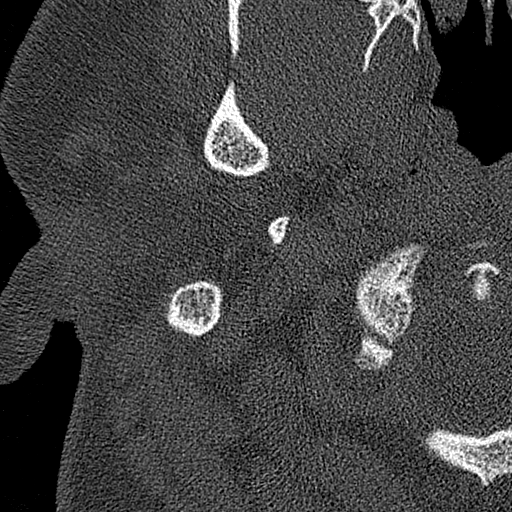
[im 14/83  bone]
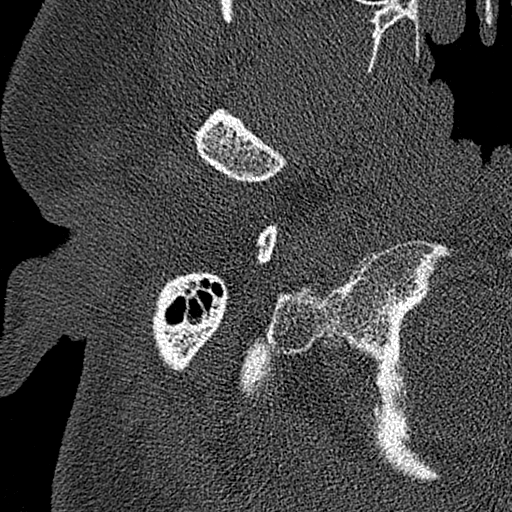
[im 21/83  bone]
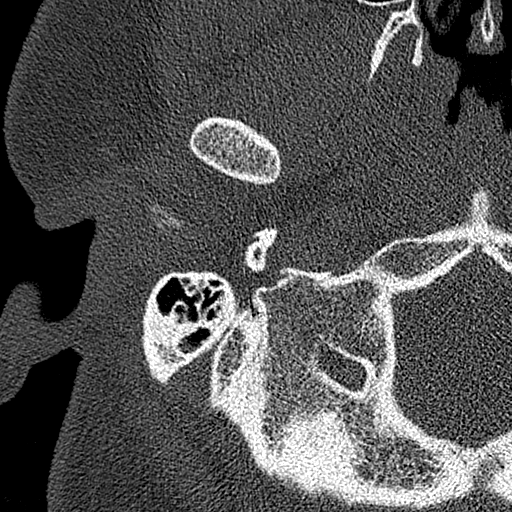
[im 28/83  bone]
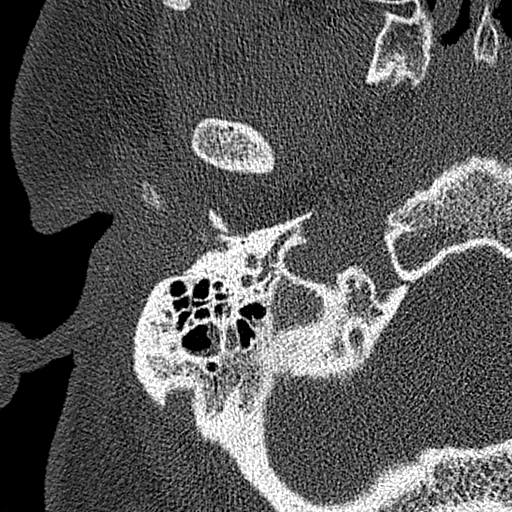
[im 35/83  brain]
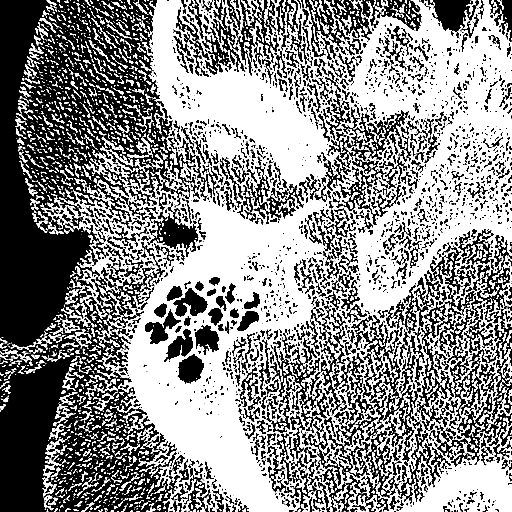
[im 35/83  bone]
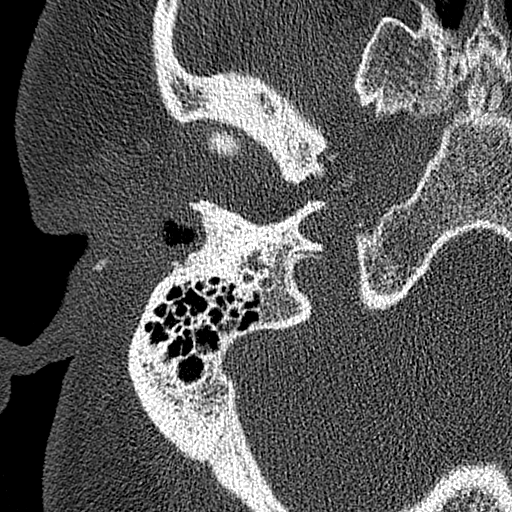
[im 42/83  bone]
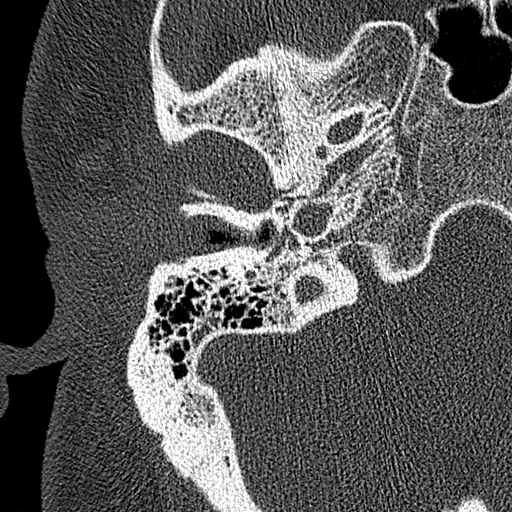
[im 48/83  bone]
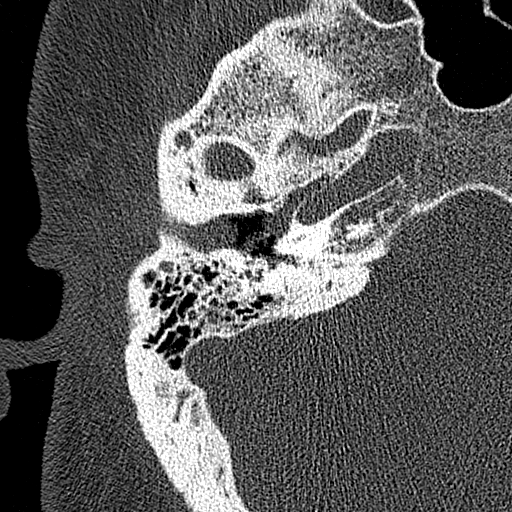
[im 55/83  bone]
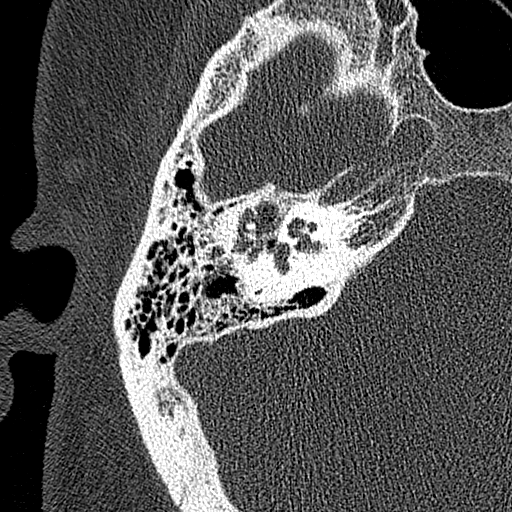
[im 62/83  brain]
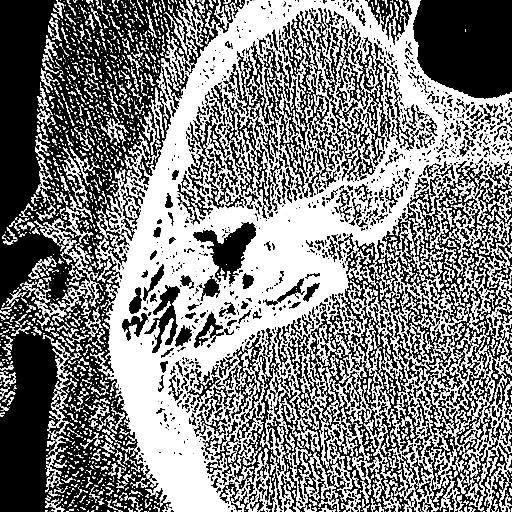
[im 62/83  bone]
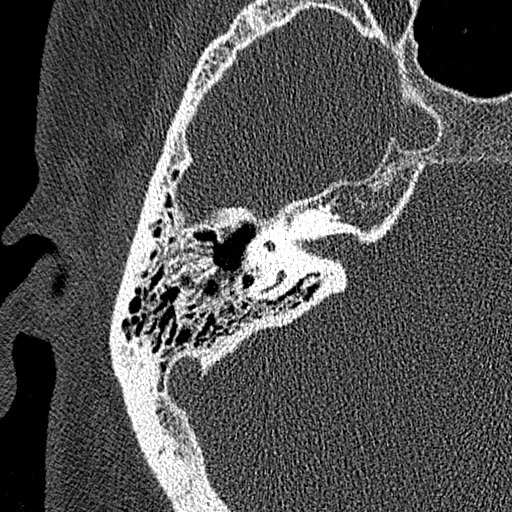
[im 69/83  bone]
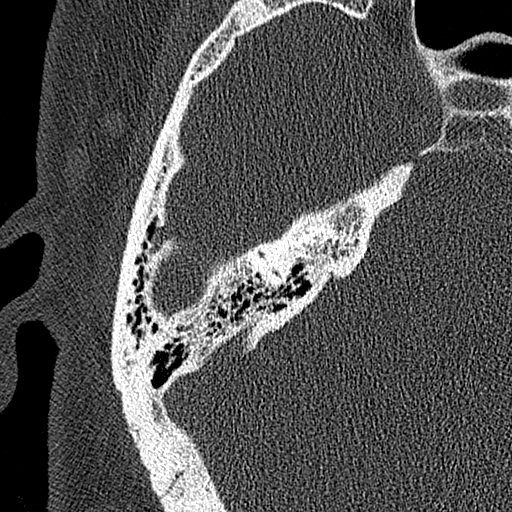
[im 76/83  bone]
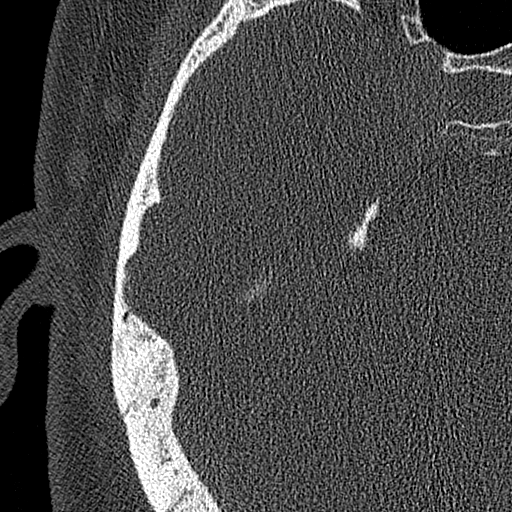

[13 of 40 positions shown; findings below may reference images not displayed]

FINDINGS: Right temporal bone:

Soft tissue thickening along the cartilaginous external auditory
canal with occlusion of the canal. Nonspecific contiguous
opacification of the partially occluded bony external auditory
canal. No erosive changes. Tympanic membrane is not well evaluated
due to adjacent density. Partial opacification of the middle ear
including the mesotympanum. Ossicles are unremarkable. Cochlea and
semicircular canals are unremarkable. Tegmen is intact. Facial nerve
demonstrates normal course. Patchy mastoid opacification.

Left temporal bone:

Mild soft tissue thickening along the external auditory canal.
Tympanic membrane is not thickened. Middle ear cleft and ossicles
are unremarkable. Cochlea and semicircular canals are unremarkable.
Tegmen is intact. Facial nerve demonstrates normal course. Minor
mastoid opacification.
IMPRESSION: Nonspecific soft tissue thickening/opacification of the
cartilaginous greater than bony right external auditory canal. No
erosive changes. May reflect otitis externa and media. No erosive
changes.

Nonspecific patchy right mastoid opacification. No evidence of
coalescent mastoiditis.

## 2021-12-04 ENCOUNTER — Encounter: Payer: Self-pay | Admitting: Psychiatry

## 2021-12-04 ENCOUNTER — Ambulatory Visit (INDEPENDENT_AMBULATORY_CARE_PROVIDER_SITE_OTHER): Payer: No Typology Code available for payment source | Admitting: Psychiatry

## 2021-12-04 ENCOUNTER — Other Ambulatory Visit: Payer: Self-pay | Admitting: Psychiatry

## 2021-12-04 VITALS — BP 112/76 | HR 63

## 2021-12-04 DIAGNOSIS — G4730 Sleep apnea, unspecified: Secondary | ICD-10-CM | POA: Diagnosis not present

## 2021-12-04 DIAGNOSIS — G4700 Insomnia, unspecified: Secondary | ICD-10-CM

## 2021-12-04 DIAGNOSIS — F331 Major depressive disorder, recurrent, moderate: Secondary | ICD-10-CM

## 2021-12-04 DIAGNOSIS — F3341 Major depressive disorder, recurrent, in partial remission: Secondary | ICD-10-CM | POA: Diagnosis not present

## 2021-12-04 DIAGNOSIS — F32A Depression, unspecified: Secondary | ICD-10-CM

## 2021-12-04 DIAGNOSIS — F411 Generalized anxiety disorder: Secondary | ICD-10-CM

## 2021-12-04 MED ORDER — AUVELITY 45-105 MG PO TBCR: EXTENDED_RELEASE_TABLET | ORAL | 0 refills | Status: DC

## 2021-12-04 MED ORDER — AUVELITY 45-105 MG PO TBCR: 1.0000 | EXTENDED_RELEASE_TABLET | Freq: Two times a day (BID) | ORAL | 1 refills | Status: DC

## 2021-12-04 MED ORDER — TRAZODONE HCL 50 MG PO TABS: ORAL_TABLET | ORAL | 1 refills | Status: DC

## 2021-12-04 MED ORDER — SERTRALINE HCL 100 MG PO TABS
200.0000 mg | ORAL_TABLET | Freq: Every day | ORAL | 1 refills | Status: DC
Start: 2021-12-04 — End: 2022-07-06

## 2021-12-04 MED ORDER — MODAFINIL 200 MG PO TABS: ORAL_TABLET | ORAL | 5 refills | Status: DC

## 2021-12-04 NOTE — Progress Notes (Signed)
Bryce Dillon ?[id number] ?09/11/1990 ?31 y.o. ? ?Subjective:  ? ?Patient ID:  Bryce Dillon is a 31 y.o. (DOB 11/15/1990) male. ? ?Chief Complaint:  ?Chief Complaint  ?Patient presents with  ? Depression  ? Anxiety  ? ? ?HPI ?Bryce Dillon presents to the office today for follow-up of anxiety, depression, and insomnia.  ?He is accompanied by his mother. He reports, "I do get down quite a bit." He reports that he feels down most of the time. His mother also reports that she notices depressive episodes. He reports that he will be more depressed for a few days. He reports that he will have low energy, low motivation, and wants to sleep more. Notices more nausea when he is depressed and appetite is lower. He reports that he is not enjoying gaming as much as he has in the past. Has stopped watching some things on TV that he used to enjoy. Difficulty falling asleep and difficulty waking up. Sleeping 6-7 hours on average. He reports difficulty with concentration and that he has difficulty sustaining focus. Denies SI.  ? ?He reports that anxiety has improved. He reports "a lot of issues with body image." He has been trying to socialize more, even if it is online. He reports that he feels "self-conscious." He is going into the office 2-3 days a week and this causes him anxiety. He tries to go in on days that it is less crowded. He reports that gaming may be compulsive at times. He reports that compulsive spending has improved. Denies any recent panic attacks. He reports excessive worry.  ? ?His great-aunt is 33 yo and she lives with them and fractured her hip. Family is taking care of her. Family is having to go through his room at times to use the bathroom. He and family are considering re-arranging house to better accommodate everyone's needs.  ? ?He reports that there are multiple changes at his work.  ? ?Has not seen therapist recently.  ? ?Past Psychiatric Medication Trials: ?Rexulti-  Has had tongue movements for several  months. Some decrease in movements with addition of Gingko. Unsure of benefits.  ?Geodon ?Abilify ?Cymbalta ?Lexapro ?Sertraline- Helpful for anxiety ?Wellbutrin-racing thoughts ?Buspar- Side effects (HA's, tired) ?Lithium-Caused tremor ?Lamictal-unsure of benefits ?Modafanil- unsure of benefit. ?Trazodone ?Xanax ?Propranolol- Ineffective ?  ? ?AIMS   ? ?Flowsheet Row Office Visit from 10/20/2020 in [facility]  ?AIMS Total Score 1  ? ?  ? ?PHQ2-9   ? ?Flowsheet Row Video Visit from 03/21/2020 in [facility]  ?PHQ-2 Total Score 6  ?PHQ-9 Total Score 21  ? ?  ? ?Flowsheet Row ED to Hosp-Admission (Discharged) from 10/08/2020 in [facility]  ?C-SSRS RISK CATEGORY No Risk  ? ?  ?  ? ?Review of Systems:  ?Review of Systems  ?Gastrointestinal:  Positive for nausea.  ?Musculoskeletal:  Negative for gait problem.  ?Neurological:   ?     Notices some occ mild involuntary tongue movements  ?Psychiatric/Behavioral:    ?     Please refer to HPI  ? ?Medications: I have reviewed the patient's current medications. ? ?Current Outpatient Medications  ?Medication Sig Dispense Refill  ? Dextromethorphan-buPROPion ER (AUVELITY) 45-105 MG TBCR Take 1 tablet daily for 3 days, then increase to 1 tablet twice daily 30 tablet 0  ? Dextromethorphan-buPROPion ER (AUVELITY) 45-105 MG TBCR Take 1 tablet by mouth 2 (two) times daily. 60 tablet 1  ? diphenhydrAMINE (BENADRYL) 25 MG tablet Take 25  mg by mouth at bedtime as needed for sleep.    ? omeprazole (PRILOSEC) 20 MG capsule Take 20 mg by mouth daily as needed.    ? modafinil (PROVIGIL) 200 MG tablet Take 1 tablet (200 mg total) by mouth every morning AND 0.5 tablets (100 mg total) daily at 12 noon. 45 tablet 5  ? sertraline (ZOLOFT) 100 MG tablet Take 2 tablets (200 mg total) by mouth daily. 180 tablet 1  ? traZODone (DESYREL) 50 MG tablet TAKE 1 TABLET BY MOUTH EVERYDAY AT BEDTIME 90 tablet 1  ? ?No current facility-administered  medications for this visit.  ? ? ?Medication Side Effects: Other: stumbling over words ? ?Allergies: No Known Allergies ? ?Past Medical History:  ?Diagnosis Date  ? Anxiety   ? Depression   ? ? ?Past Medical History, Surgical history, Social history, and Family history were reviewed and updated as appropriate.  ? ?Please see review of systems for further details on the patient's review from today.  ? ?Objective:  ? ?Physical Exam:  ?BP 112/76   Pulse 63  ? ?Physical Exam ?Constitutional:   ?   General: He is not in acute distress. ?Musculoskeletal:     ?   General: No deformity.  ?Neurological:  ?   Mental Status: He is alert and oriented to person, place, and time.  ?   Coordination: Coordination normal.  ?Psychiatric:     ?   Attention and Perception: Attention and perception normal. He does not perceive auditory or visual hallucinations.     ?   Mood and Affect: Mood is depressed. Affect is not labile, blunt, angry or inappropriate.     ?   Speech: Speech normal.     ?   Behavior: Behavior normal.     ?   Thought Content: Thought content normal. Thought content is not paranoid or delusional. Thought content does not include homicidal or suicidal ideation. Thought content does not include homicidal or suicidal plan.     ?   Cognition and Memory: Cognition and memory normal.     ?   Judgment: Judgment normal.  ?   Comments: Insight intact ?Mood is less anxious  ? ? ?Lab Review:  ?   ?Component Value Date/Time  ? NA 136 10/09/2020 0335  ? K 3.3 (L) 10/09/2020 0335  ? CL 104 10/09/2020 0335  ? CO2 22 10/09/2020 0335  ? GLUCOSE 110 (H) 10/09/2020 0335  ? BUN 8 10/09/2020 0335  ? CREATININE 0.90 10/09/2020 0335  ? CALCIUM 8.3 (L) 10/09/2020 0335  ? PROT 6.2 (L) 10/09/2020 0335  ? ALBUMIN 2.9 (L) 10/09/2020 0335  ? AST 15 10/09/2020 0335  ? ALT 25 10/09/2020 0335  ? ALKPHOS 69 10/09/2020 0335  ? BILITOT 1.4 (H) 10/09/2020 0335  ? GFRNONAA >60 10/09/2020 0335  ? ? ?   ?Component Value Date/Time  ? WBC 9.7 10/09/2020  0335  ? RBC 4.88 10/09/2020 0335  ? HGB 13.2 10/09/2020 0335  ? HCT 40.0 10/09/2020 0335  ? PLT 215 10/09/2020 0335  ? MCV 82.0 10/09/2020 0335  ? MCH 27.0 10/09/2020 0335  ? MCHC 33.0 10/09/2020 0335  ? RDW 15.5 10/09/2020 0335  ? LYMPHSABS 1.8 10/08/2020 1236  ? MONOABS 0.6 10/08/2020 1236  ? EOSABS 0.2 10/08/2020 1236  ? BASOSABS 0.1 10/08/2020 1236  ? ? ?No results found for: POCLITH, LITHIUM  ? ?No results found for: PHENYTOIN, PHENOBARB, VALPROATE, CBMZ  ? ?.res ?Assessment: Plan:   ?Pt seen for 30  minutes and time spent discussing treatment options for depression. Discussed that Sertraline has been helpful overall for his anxiety and somewhat for depression, and therefore recommend considering augmentation strategies to use in combination with Sertraline. Discussed attempting to avoid atypical antipsychotics if possible since he has h/o TD with low dose antipsychotic that has less risk of TD compared to other antipsychotics. Discussed that he also has tried and failed other augmentation strategies, such as Wellbutrin and Lamictal. Discussed potential benefits, risks, and side effects of Auvelity. Will start Auvelity 45-105 mg one tablet daily for 3 days, then increase Auvelity to one tablet twice daily for depression.  ?Continue Sertraline 200 mg po qd for depression and anxiety.  ?Continue Modafinil 200 mg in the morning and 100 mg at noon for sleep apnea.  ?Continue Trazodone 50 mg po QHS prn insomnia. ?Recommend resuming therapy with Monique Crutchfield, LCSW to address recent depressive s/s.  ?Pt to follow-up in 4 weeks or sooner if clinically indicated.  ?Patient advised to contact office with any questions, adverse effects, or acute worsening in signs and symptoms. ? ?Loyde was seen today for depression and anxiety. ? ?Diagnoses and all orders for this visit: ? ?Moderate episode of recurrent major depressive disorder (HCC) ?-     Dextromethorphan-buPROPion ER (AUVELITY) 45-105 MG TBCR; Take 1 tablet  daily for 3 days, then increase to 1 tablet twice daily ? ?Recurrent major depressive disorder, in partial remission (HCC) ?-     modafinil (PROVIGIL) 200 MG tablet; Take 1 tablet (200 mg total) by mouth every morning

## 2021-12-04 NOTE — Telephone Encounter (Signed)
Yes this needs a pA ?

## 2021-12-04 NOTE — Telephone Encounter (Signed)
Needing PA 

## 2021-12-05 ENCOUNTER — Telehealth: Payer: Self-pay

## 2021-12-05 NOTE — Telephone Encounter (Addendum)
Prior Authorization ?Initiated  ?Auvelity 45-105 BID  ?[facility] ? ?Approval received 5/2 ?Effective:  12/05/2021 - 12/06/2022.  ?

## 2021-12-11 NOTE — Telephone Encounter (Signed)
Prior Approval received 12/05/2021-12/06/2022 ?

## 2022-01-02 ENCOUNTER — Telehealth (INDEPENDENT_AMBULATORY_CARE_PROVIDER_SITE_OTHER): Payer: No Typology Code available for payment source | Admitting: Psychiatry

## 2022-01-02 ENCOUNTER — Encounter: Payer: Self-pay | Admitting: Psychiatry

## 2022-01-02 DIAGNOSIS — F330 Major depressive disorder, recurrent, mild: Secondary | ICD-10-CM | POA: Diagnosis not present

## 2022-01-02 DIAGNOSIS — F4010 Social phobia, unspecified: Secondary | ICD-10-CM | POA: Diagnosis not present

## 2022-01-02 DIAGNOSIS — F411 Generalized anxiety disorder: Secondary | ICD-10-CM | POA: Diagnosis not present

## 2022-01-02 MED ORDER — ALPRAZOLAM 0.5 MG PO TABS: ORAL_TABLET | ORAL | 0 refills | Status: DC

## 2022-01-02 MED ORDER — AUVELITY 45-105 MG PO TBCR
1.0000 | EXTENDED_RELEASE_TABLET | Freq: Two times a day (BID) | ORAL | 2 refills | Status: DC
Start: 2022-01-02 — End: 2022-07-11

## 2022-01-02 NOTE — Progress Notes (Signed)
Bryce Dillon [id number] 06/18/1991 31 y.o.  Virtual Visit via Video Note  I connected with pt @ on 01/02/22 at  9:30 AM EDT by a video enabled telemedicine application and verified that I am speaking with the correct person using two identifiers.   I discussed the limitations of evaluation and management by telemedicine and the availability of in person appointments. The patient expressed understanding and agreed to proceed.  I discussed the assessment and treatment plan with the patient. The patient was provided an opportunity to ask questions and all were answered. The patient agreed with the plan and demonstrated an understanding of the instructions.   The patient was advised to call back or seek an in-person evaluation if the symptoms worsen or if the condition fails to improve as anticipated.  I provided 30 minutes of non-face-to-face time during this encounter.  The patient was located at home.  The provider was located in [place], PMHNP   Subjective:   Patient ID:  Bryce Dillon is a 31 y.o. (DOB Jul 16, 1991) male.  Chief Complaint:  Chief Complaint  Patient presents with   Anxiety   Follow-up    Depression   Sleeping Problem    HPI [name] presents for follow-up of depression, anxiety, and insomnia. He reports, "I think it is helping a lot.. I generally feel better." He notices improved motivation and mood. He reports that his mood is not as sad. He notices some anxiety and worry. He reports that anxiety has been less overall and is now episodic and situational. He has some anxiety about upcoming trip to meet some friends that he has not met in real life and this will be his first time traveling alone. He experiences some gagging and dry heaving again. Denies panic s/s. Energy has improved. Denies irritability. He reports some increased middle of the night awakenings and is usually able to get back to sleep. Staying up later at night. Estimates sleeping 5-6 hours  a night. Some slight improvement in concentration. Occasional distractibility while working. Denies any recent compulsions. Denies SI.   He reports that he has had 15 lbs intentional weight loss in the last month with exercising and making diet changes. He reports that he is trying to cut back on carbohydrates and sodas. Appetite has been decreased.   He reports that work has been going ok. Went to [employer] training last week. He reports that he had a recent performance review that indicated he was doing ok.   Has not seen therapist recently.   Past Psychiatric Medication Trials: Rexulti-  Has had tongue movements for several months. Some decrease in movements with addition of Gingko. Unsure of benefits.  Geodon Abilify Cymbalta Lexapro Sertraline- Helpful for anxiety Wellbutrin-racing thoughts Buspar- Side effects (HA's, tired) Lithium-Caused tremor Lamictal-unsure of benefits Modafanil- unsure of benefit. Trazodone- Difficulty waking up if taken too late.  Xanax klonopin Propranolol- Ineffective    Review of Systems:  Review of Systems  Gastrointestinal:  Positive for nausea.       Gagging, dry heaving when thinking about things that cause anxiety.  Musculoskeletal:  Negative for gait problem.  Neurological:  Negative for tremors and headaches.       Some involuntary tongue movements  Psychiatric/Behavioral:         Please refer to HPI   Medications: I have reviewed the patient's current medications.  Current Outpatient Medications  Medication Sig Dispense Refill   ALPRAZolam (XANAX) 0.5 MG tablet Take 1/2-1 tablet  as needed for panic or severe anxiety 15 tablet 0   diphenhydrAMINE (BENADRYL) 25 MG tablet Take 25 mg by mouth at bedtime as needed for sleep.     modafinil (PROVIGIL) 200 MG tablet Take 1 tablet (200 mg total) by mouth every morning AND 0.5 tablets (100 mg total) daily at 12 noon. 45 tablet 5   omeprazole (PRILOSEC) 20 MG capsule Take 20 mg by  mouth daily as needed.     sertraline (ZOLOFT) 100 MG tablet Take 2 tablets (200 mg total) by mouth daily. 180 tablet 1   traZODone (DESYREL) 50 MG tablet TAKE 1 TABLET BY MOUTH EVERYDAY AT BEDTIME (Patient taking differently: 50 mg at bedtime as needed. TAKE 1 TABLET BY MOUTH EVERYDAY AT BEDTIME) 90 tablet 1   Dextromethorphan-buPROPion ER (AUVELITY) 45-105 MG TBCR Take 1 tablet by mouth 2 (two) times daily. 60 tablet 2   No current facility-administered medications for this visit.    Medication Side Effects: Other: Possible sleep disturbance  Allergies: No Known Allergies  Past Medical History:  Diagnosis Date   Anxiety    Depression     Family History  Problem Relation Age of Onset   Alcohol abuse Father     Social History   Socioeconomic History   Marital status: Single    Spouse name: Not on file   Number of children: Not on file   Years of education: Not on file   Highest education level: Not on file  Occupational History   Occupation: IT/Fund Raising  Tobacco Use   Smoking status: Never   Smokeless tobacco: Never  Vaping Use   Vaping Use: Never used  Substance and Sexual Activity   Alcohol use: No   Drug use: No   Sexual activity: Never  Other Topics Concern   Not on file  Social History Narrative   Not on file   Social Determinants of Health   Financial Resource Strain: Not on file  Food Insecurity: Not on file  Transportation Needs: Not on file  Physical Activity: Not on file  Stress: Not on file  Social Connections: Not on file  Intimate Partner Violence: Not on file    Past Medical History, Surgical history, Social history, and Family history were reviewed and updated as appropriate.   Please see review of systems for further details on the patient's review from today.   Objective:   Physical Exam:  There were no vitals taken for this visit.  Physical Exam Neurological:     Mental Status: He is alert and oriented to person, place, and  time.     Cranial Nerves: No dysarthria.  Psychiatric:        Attention and Perception: Attention and perception normal.        Speech: Speech normal.        Behavior: Behavior is cooperative.        Thought Content: Thought content normal. Thought content is not paranoid or delusional. Thought content does not include homicidal or suicidal ideation. Thought content does not include homicidal or suicidal plan.        Cognition and Memory: Cognition and memory normal.        Judgment: Judgment normal.     Comments: Insight intact Mood presents as less depressed compared to last exam. Mood presents as anxious when discussing upcoming trip.    Lab Review:     Component Value Date/Time   NA 136 10/09/2020 0335   K 3.3 (L) 10/09/2020 0335  CL 104 10/09/2020 0335   CO2 22 10/09/2020 0335   GLUCOSE 110 (H) 10/09/2020 0335   BUN 8 10/09/2020 0335   CREATININE 0.90 10/09/2020 0335   CALCIUM 8.3 (L) 10/09/2020 0335   PROT 6.2 (L) 10/09/2020 0335   ALBUMIN 2.9 (L) 10/09/2020 0335   AST 15 10/09/2020 0335   ALT 25 10/09/2020 0335   ALKPHOS 69 10/09/2020 0335   BILITOT 1.4 (H) 10/09/2020 0335   GFRNONAA >60 10/09/2020 0335       Component Value Date/Time   WBC 9.7 10/09/2020 0335   RBC 4.88 10/09/2020 0335   HGB 13.2 10/09/2020 0335   HCT 40.0 10/09/2020 0335   PLT 215 10/09/2020 0335   MCV 82.0 10/09/2020 0335   MCH 27.0 10/09/2020 0335   MCHC 33.0 10/09/2020 0335   RDW 15.5 10/09/2020 0335   LYMPHSABS 1.8 10/08/2020 1236   MONOABS 0.6 10/08/2020 1236   EOSABS 0.2 10/08/2020 1236   BASOSABS 0.1 10/08/2020 1236    No results found for: POCLITH, LITHIUM   No results found for: PHENYTOIN, PHENOBARB, VALPROATE, CBMZ   .res Assessment: Plan:   Pt seen for 30 minutes and time spent discussing response to Auvelity and recent anxiety. He reports that increased anxiety is likely due to putting himself into more situations that typically trigger his anxiety and anticipates anxiety  improving after his trip. Discussed option of re-starting low dose Xanax prn to have on upcoming trip. Discussed potential benefits, risk, and side effects of benzodiazepines to include potential risk of tolerance and dependence. Advised patient to take lowest possible effective dose to minimize risk of dependence and tolerance. Will re-start Xanax 0.5 mg 1/2-1 tab as needed for panic.  Discussed that it may be helpful to re-start therapy to improve anxiety.  He reports that he would like to work on establishing a more consistent sleep schedule and going to bed earlier to improve sleep. Discussed that it may be helpful to use Trazodone 50 mg QHS prn for 5-7 consecutive nights to help re-set sleep schedule. Will continue Auvelity 45-105 mg one tablet twice daily for depression.  Continue Sertraline 200 mg po qd for depression and anxiety. Continue Modafinil 200 mg in the morning and 100 mg at noon for ADHD.  Pt to follow-up with this provider in 4 weeks or sooner if clinically indicated.  Patient advised to contact office with any questions, adverse effects, or acute worsening in signs and symptoms.   Bryce Dillon was seen today for anxiety, follow-up and sleeping problem.  Diagnoses and all orders for this visit:  Social anxiety disorder -     ALPRAZolam (XANAX) 0.5 MG tablet; Take 1/2-1 tablet as needed for panic or severe anxiety  Generalized anxiety disorder  Mild episode of recurrent major depressive disorder ([place]) -     Dextromethorphan-buPROPion ER (AUVELITY) 45-105 MG TBCR; Take 1 tablet by mouth 2 (two) times daily.     Please see After Visit Summary for patient specific instructions.  No future appointments.   No orders of the defined types were placed in this encounter.     -------------------------------

## 2022-06-29 ENCOUNTER — Other Ambulatory Visit: Payer: Self-pay | Admitting: Psychiatry

## 2022-06-29 DIAGNOSIS — F411 Generalized anxiety disorder: Secondary | ICD-10-CM

## 2022-06-29 DIAGNOSIS — F32A Depression, unspecified: Secondary | ICD-10-CM

## 2022-07-03 NOTE — Telephone Encounter (Signed)
Please schedule appt

## 2022-07-06 NOTE — Telephone Encounter (Signed)
Pt has appt 1/26

## 2022-07-11 ENCOUNTER — Ambulatory Visit (INDEPENDENT_AMBULATORY_CARE_PROVIDER_SITE_OTHER): Payer: No Typology Code available for payment source | Admitting: Psychiatry

## 2022-07-11 ENCOUNTER — Encounter: Payer: Self-pay | Admitting: Psychiatry

## 2022-07-11 DIAGNOSIS — F411 Generalized anxiety disorder: Secondary | ICD-10-CM | POA: Diagnosis not present

## 2022-07-11 DIAGNOSIS — G4700 Insomnia, unspecified: Secondary | ICD-10-CM | POA: Diagnosis not present

## 2022-07-11 DIAGNOSIS — F3341 Major depressive disorder, recurrent, in partial remission: Secondary | ICD-10-CM

## 2022-07-11 MED ORDER — AUVELITY 45-105 MG PO TBCR: 1.0000 | EXTENDED_RELEASE_TABLET | Freq: Two times a day (BID) | ORAL | 5 refills | Status: DC

## 2022-07-11 MED ORDER — TRAZODONE HCL 50 MG PO TABS: 50.0000 mg | ORAL_TABLET | Freq: Every evening | ORAL | 1 refills | Status: DC | PRN

## 2022-07-11 MED ORDER — SERTRALINE HCL 100 MG PO TABS: 200.0000 mg | ORAL_TABLET | Freq: Every day | ORAL | 1 refills | Status: DC

## 2022-07-11 NOTE — Progress Notes (Signed)
Bryce Dillon [id number] 01-28-1991 31 y.o.  Subjective:   Patient ID:  Bryce Dillon is a 31 y.o. (DOB 09-16-1990) male.  Chief Complaint:  Chief Complaint  Patient presents with   Follow-up    Anxiety, depression, insomnia    HPI Hurman Ketelsen presents to the office today for follow-up of anxiety, depression, and insomnia. "I've been feeling for the most part pretty good." Denies significant depression episodes. He reports some anxiety, especially with trying to be more social. Micah Flesher to visit friends in June and October, including getting on an airplane for the first time. He reports some stress with home situation and has limited privacy. Denies panic attacks. Sleep has been ok. Energy and motivation have been somewhat low. He reports difficulty with concentration and is easily distracted. Denies SI.   He is going into the office 2 days a week. He does not like the commute and does not have much interaction while he is there.   He reports that he has had intentional weight loss. He has been thinking about cooking healthy meals.   Has not seen therapist recently.   Has not needed Xanax prn recently (last filled 01/02/22).  Has not been taking Modafinil due to limited benefit.   Past Psychiatric Medication Trials: Rexulti-  Has had tongue movements for several months. Some decrease in movements with addition of Gingko. Unsure of benefits.  Geodon Abilify Cymbalta Lexapro Sertraline- Helpful for anxiety Wellbutrin-racing thoughts Buspar- Side effects (HA's, tired) Lithium-Caused tremor Lamictal-unsure of benefits Modafanil- unsure of benefit. Trazodone- Difficulty waking up if taken too late.  Xanax klonopin Propranolol- Ineffective  AIMS    Flowsheet Row Office Visit from 10/20/2020 in [facility]  AIMS Total Score 1      PHQ2-9    Flowsheet Row Video Visit from 03/21/2020 in [facility]  PHQ-2 Total Score 6  PHQ-9 Total Score 21       Flowsheet Row ED to Hosp-Admission (Discharged) from 10/08/2020 in [facility]  C-SSRS RISK CATEGORY No Risk        Review of Systems:  Review of Systems  Cardiovascular:        He reports occ palpitations  Musculoskeletal:  Negative for gait problem.  Neurological:  Negative for tremors.  Psychiatric/Behavioral:         Please refer to HPI    Medications: I have reviewed the patient's current medications.  Current Outpatient Medications  Medication Sig Dispense Refill   omeprazole (PRILOSEC) 20 MG capsule Take 20 mg by mouth daily as needed.     ALPRAZolam (XANAX) 0.5 MG tablet Take 1/2-1 tablet as needed for panic or severe anxiety (Patient not taking: Reported on 07/11/2022) 15 tablet 0   Dextromethorphan-buPROPion ER (AUVELITY) 45-105 MG TBCR Take 1 tablet by mouth 2 (two) times daily. 30 tablet 5   diphenhydrAMINE (BENADRYL) 25 MG tablet Take 25 mg by mouth at bedtime as needed for sleep.     sertraline (ZOLOFT) 100 MG tablet Take 2 tablets (200 mg total) by mouth daily. 180 tablet 1   traZODone (DESYREL) 50 MG tablet Take 1 tablet (50 mg total) by mouth at bedtime as needed. TAKE 1 TABLET BY MOUTH EVERYDAY AT BEDTIME 90 tablet 1   No current facility-administered medications for this visit.    Medication Side Effects: None  Allergies: No Known Allergies  Past Medical History:  Diagnosis Date   Anxiety    Depression     Past Medical History, Surgical  history, Social history, and Family history were reviewed and updated as appropriate.   Please see review of systems for further details on the patient's review from today.   Objective:   Physical Exam:  BP 103/73   Pulse 76   Wt (!) 385 lb (174.6 kg)   BMI 53.70 kg/m   Physical Exam Neurological:     Mental Status: He is alert and oriented to person, place, and time.     Cranial Nerves: No dysarthria.  Psychiatric:        Attention and Perception: Attention and perception normal.         Mood and Affect: Mood normal.        Speech: Speech normal.        Behavior: Behavior is cooperative.        Thought Content: Thought content normal. Thought content is not paranoid or delusional. Thought content does not include homicidal or suicidal ideation. Thought content does not include homicidal or suicidal plan.        Cognition and Memory: Cognition and memory normal.        Judgment: Judgment normal.     Comments: Insight intact     Lab Review:     Component Value Date/Time   NA 136 10/09/2020 0335   K 3.3 (L) 10/09/2020 0335   CL 104 10/09/2020 0335   CO2 22 10/09/2020 0335   GLUCOSE 110 (H) 10/09/2020 0335   BUN 8 10/09/2020 0335   CREATININE 0.90 10/09/2020 0335   CALCIUM 8.3 (L) 10/09/2020 0335   PROT 6.2 (L) 10/09/2020 0335   ALBUMIN 2.9 (L) 10/09/2020 0335   AST 15 10/09/2020 0335   ALT 25 10/09/2020 0335   ALKPHOS 69 10/09/2020 0335   BILITOT 1.4 (H) 10/09/2020 0335   GFRNONAA >60 10/09/2020 0335       Component Value Date/Time   WBC 9.7 10/09/2020 0335   RBC 4.88 10/09/2020 0335   HGB 13.2 10/09/2020 0335   HCT 40.0 10/09/2020 0335   PLT 215 10/09/2020 0335   MCV 82.0 10/09/2020 0335   MCH 27.0 10/09/2020 0335   MCHC 33.0 10/09/2020 0335   RDW 15.5 10/09/2020 0335   LYMPHSABS 1.8 10/08/2020 1236   MONOABS 0.6 10/08/2020 1236   EOSABS 0.2 10/08/2020 1236   BASOSABS 0.1 10/08/2020 1236    No results found for: "POCLITH", "LITHIUM"   No results found for: "PHENYTOIN", "PHENOBARB", "VALPROATE", "CBMZ"   .res Assessment: Plan:    Will continue current plan of care since target signs and symptoms are well controlled without any tolerability issues. He reports that he would like to re-start therapy with a new therapist. Referral made to Elio Forget, [facility].  Will continue Auvelity 45-105 mg one tablet twice daily for depression.  Continue Sertraline 200 mg po qd for anxiety and depression.  Continue Trazodone 50 mg po QHS prn insomnia.  He  reports that he has not needed Alprazolam prn recently. Advised pt to contact office if he needs Alprazolam prn in the future.  Pt to follow-up in 6 months or sooner if clinically indicated.  Patient advised to contact office with any questions, adverse effects, or acute worsening in signs and symptoms.   Eino was seen today for follow-up.  Diagnoses and all orders for this visit:  Recurrent major depressive disorder, in partial remission (HCC) -     Dextromethorphan-buPROPion ER (AUVELITY) 45-105 MG TBCR; Take 1 tablet by mouth 2 (two) times daily. -     sertraline (  ZOLOFT) 100 MG tablet; Take 2 tablets (200 mg total) by mouth daily.  Generalized anxiety disorder -     sertraline (ZOLOFT) 100 MG tablet; Take 2 tablets (200 mg total) by mouth daily.  Insomnia, unspecified type -     traZODone (DESYREL) 50 MG tablet; Take 1 tablet (50 mg total) by mouth at bedtime as needed. TAKE 1 TABLET BY MOUTH EVERYDAY AT BEDTIME     Please see After Visit Summary for patient specific instructions.  Future Appointments  Date Time Provider Department Center  08/21/2022  9:00 AM Waldron Session, [facility] None  01/10/2023  8:30 AM Corie Chiquito, PMHNP CP-CP None    No orders of the defined types were placed in this encounter.   -------------------------------

## 2022-08-21 ENCOUNTER — Ambulatory Visit: Payer: No Typology Code available for payment source | Admitting: Mental Health

## 2022-08-21 DIAGNOSIS — F3341 Major depressive disorder, recurrent, in partial remission: Secondary | ICD-10-CM

## 2022-08-21 NOTE — Progress Notes (Signed)
[facility] Initial Adult Exam  Name: Bryce Dillon Date: 08/21/2022 MRN: [id number] DOB: 08-15-1990 PCP: Derrill Center., MD  Time spent: 53 minutes  Reason for Visit Tyna Jaksch Problem: patient stated he is trying get out more but this increases his anxiety and depression. Stated he struggles with social anxiety , some improvements over the past year; he flew for the first time, visited friends out of state. Reports panic attacks history, last was in 2017. He experiences social anxiety daily. 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 Struggles with lack of motivation when discussing his depression. He has lost over 100lbs in the last year. Tries to workout, diet but wants to work on getting more motivated. He has tried intermittent fasting which helped. Having difficulty getting to sleep over the past month, tends to worry at night. Lately, his sleep schedule has been inconsistent.  Coping with anxiety since high school. His mother has a history of anxiety and depression.  Patient shared how he struggles with his self-esteem, is talking to 1 girl that he is friends with, however, he stated she is married and he does not want to pursue a relationship with her although he admits he is self-conscious about how he is perceived by her.  He stated that he has tried recently with online dating, expresses his desire to meet someone but also engage socially to have more of a friend network locally.  Mental Status Exam:    Appearance:    Casual     Behavior:   Appropriate  Motor:   WNL  Speech/Language:    Clear and Coherent  Affect:   Full range   Mood:   Euthymic  Thought process:   Logical, linear, goal directed  Thought content:     WNL  Sensory/Perceptual disturbances:     none  Orientation:   x4  Attention:   Good  Concentration:   Good  Memory:   Intact  Fund of knowledge:    Consistent with age and development  Insight:     Good  Judgment:    Good  Impulse  Control:   Good     Reported Symptoms:  sleep disturbance, daily anxiety 7/10, depression 4/10  Risk Assessment: Danger to Self:  No Self-injurious Behavior: No Danger to Others: No Duty to Warn:no Physical Aggression / Violence:No  Access to Firearms a concern: No  Gang Involvement:No  Patient / guardian was educated about steps to take if suicide or homicide risk level increases between visits: yes While future psychiatric events cannot be accurately predicted, the patient does not currently require acute inpatient psychiatric care and does not currently meet [facility] involuntary commitment criteria.  Substance Abuse History: Current substance abuse: none  Past Psychiatric History:   Previous psychological history is significant for anxiety and depression Outpatient Providers: Tye [place] Carrier - lPC, Monique Crutchfield LCSW History of Psych Hospitalization: No  Psychological Testing: none  Abuse History: Victim - none Report needed: No. Victim of Neglect:No. Perpetrator - none Witness / Exposure to Domestic Violence: No   Protective Services Involvement: No  Witness to [employer] Violence:  No   Family History:  Raised by mother and grandmother, father left when he was age 32.  No contact with him for the past 10 years. Brother- age 58  Family History  Problem Relation Age of Onset   Alcohol abuse Father     Living situation: the patient lives with their family  Sexual Orientation:  Straight  Relationship Status: single  Support Systems; family, friends  Museum/gallery curator Stress:  No   Income/Employment/Disability: Employment  Armed forces logistics/support/administrative officer: No   Educational History: Education: BS in Museum/gallery conservator:   none  Any cultural differences that may affect / interfere with treatment:  none  Recreation/Hobbies: gaming  Stressors:interpersonal  Strengths:  support system  Barriers:  none  Legal  History: Pending legal issue / charges: none History of legal issue / charges: none  Medical History/Surgical History: Past Medical History:  Diagnosis Date   Anxiety    Depression     Past Surgical History:  Procedure Laterality Date   SLEEVE GASTROPLASTY      Medications: Current Outpatient Medications  Medication Sig Dispense Refill   ALPRAZolam (XANAX) 0.5 MG tablet Take 1/2-1 tablet as needed for panic or severe anxiety (Patient not taking: Reported on 07/11/2022) 15 tablet 0   Dextromethorphan-buPROPion ER (AUVELITY) 45-105 MG TBCR Take 1 tablet by mouth 2 (two) times daily. 30 tablet 5   diphenhydrAMINE (BENADRYL) 25 MG tablet Take 25 mg by mouth at bedtime as needed for sleep.     omeprazole (PRILOSEC) 20 MG capsule Take 20 mg by mouth daily as needed.     sertraline (ZOLOFT) 100 MG tablet Take 2 tablets (200 mg total) by mouth daily. 180 tablet 1   traZODone (DESYREL) 50 MG tablet Take 1 tablet (50 mg total) by mouth at bedtime as needed. TAKE 1 TABLET BY MOUTH EVERYDAY AT BEDTIME 90 tablet 1   No current facility-administered medications for this visit.    No Known Allergies  Diagnoses:    ICD-10-CM   1. Recurrent major depressive disorder, in partial remission [facility])  F33.41       Plan of Care: TBD   Anson [place], [facility]

## 2022-09-20 ENCOUNTER — Ambulatory Visit (INDEPENDENT_AMBULATORY_CARE_PROVIDER_SITE_OTHER): Payer: No Typology Code available for payment source | Admitting: Mental Health

## 2022-09-20 DIAGNOSIS — F411 Generalized anxiety disorder: Secondary | ICD-10-CM

## 2022-09-20 NOTE — Progress Notes (Signed)
[facility] Psychotherapy Note  Name: Bryce Dillon Date: 09/20/2022 MRN: [id number] DOB: 01-27-1991 PCP: Derrill Center., MD  Time spent: 50 minutes  Treatment:  ind. therapy  Mental Status Exam:    Appearance:    Casual     Behavior:   Appropriate  Motor:   WNL  Speech/Language:    Clear and Coherent  Affect:   Full range   Mood:   Euthymic  Thought process:   Logical, linear, goal directed  Thought content:     WNL  Sensory/Perceptual disturbances:     none  Orientation:   x4  Attention:   Good  Concentration:   Good  Memory:   Intact  Fund of knowledge:    Consistent with age and development  Insight:     Good  Judgment:    Good  Impulse Control:   Good     Reported Symptoms:  sleep disturbance, daily anxiety 7/10, depression 4/10  Risk Assessment: Danger to Self:  No Self-injurious Behavior: No Danger to Others: No Duty to Warn:no Physical Aggression / Violence:No  Access to Firearms a concern: No  Gang Involvement:No  Patient / guardian was educated about steps to take if suicide or homicide risk level increases between visits: yes While future psychiatric events cannot be accurately predicted, the patient does not currently require acute inpatient psychiatric care and does not currently meet [facility] involuntary commitment criteria.    Medications: Current Outpatient Medications  Medication Sig Dispense Refill   ALPRAZolam (XANAX) 0.5 MG tablet Take 1/2-1 tablet as needed for panic or severe anxiety (Patient not taking: Reported on 07/11/2022) 15 tablet 0   Dextromethorphan-buPROPion ER (AUVELITY) 45-105 MG TBCR Take 1 tablet by mouth 2 (two) times daily. 30 tablet 5   diphenhydrAMINE (BENADRYL) 25 MG tablet Take 25 mg by mouth at bedtime as needed for sleep.     omeprazole (PRILOSEC) 20 MG capsule Take 20 mg by mouth daily as needed.     sertraline (ZOLOFT) 100 MG tablet Take 2 tablets (200 mg total) by mouth daily. 180 tablet 1   traZODone  (DESYREL) 50 MG tablet Take 1 tablet (50 mg total) by mouth at bedtime as needed. TAKE 1 TABLET BY MOUTH EVERYDAY AT BEDTIME 90 tablet 1   No current facility-administered medications for this visit.    Subjective:  Patient arrived on time for today's session.  Continue to assess needs and identify stressors.  He stated he had a recent panic attack yesterday at work.  He stated he had to leave work, went home and took his Xanax which helped and lie down for a nap.  Explored with patient contributing factors where he had difficulty initially, eventually identified how he had not been at work for the last week and a half which she feels may have caused the attack due to getting out of the house again.  He typically goes into the office 2 days/week, works at home for 3.  Assessed mood where he stated he continues to cope with low motivation, identified needing goals to work toward.  Collaboratively, we explored areas where he identified the need to get out of the house more often, possibly join a gym.  He stated he struggled with his diet over the last few weeks and has disengaged in any type of exercise.  He stated that he is considering going to graduate school but it gives him anxiety when he considers having to get letters of recommendation.  This was explored  with patient working with him to identify ways to reframe and problem solve.    Interventions:  supportive therapy, mot. Interviewing, CBT  Diagnoses:    ICD-10-CM   1. Generalized anxiety disorder  F41.1       Plan: Patient is to use CBT, mindfulness and coping skills to help manage / decrease symptoms. Patient is to increase work to isolate less often, possibly joining a gym and look into options to get into graduate school.    Long-term goal:  Reduce overall level, frequency, and intensity of the feelings of depression, anxiety and panic up to 3 consecutive months per patient report.  Short-term goal: To identify and process feelings  related to the disappointment of past painful events that increase worthless feelings.                   Patient to decrease isolated behavior, identifying and following through with ways to get out of his house more frequently.                                 Assessment of progress:  progressing    Anson Oregon, [facility]

## 2022-10-02 ENCOUNTER — Ambulatory Visit: Payer: No Typology Code available for payment source | Admitting: Mental Health

## 2022-10-16 ENCOUNTER — Ambulatory Visit: Payer: No Typology Code available for payment source | Admitting: Mental Health

## 2022-10-16 DIAGNOSIS — F411 Generalized anxiety disorder: Secondary | ICD-10-CM | POA: Diagnosis not present

## 2022-10-16 NOTE — Progress Notes (Signed)
[facility] Psychotherapy Note  Name: Bryce Dillon Date: 10/16/2022 MRN: [id number] DOB: [birth year] PCP: Derrill Center., MD  Time spent: 54 minutes  Treatment:  ind. therapy  Mental Status Exam:    Appearance:    Casual     Behavior:   Appropriate  Motor:   WNL  Speech/Language:    Clear and Coherent  Affect:   Full range   Mood:   Anxious pleasant  Thought process:   Logical, linear, goal directed  Thought content:     WNL  Sensory/Perceptual disturbances:     none  Orientation:   x4  Attention:   Good  Concentration:   Good  Memory:   Intact  Fund of knowledge:    Consistent with age and development  Insight:     Good  Judgment:    Good  Impulse Control:   Good     Reported Symptoms:  sleep disturbance, daily anxiety 7/10, depression 4/10  Risk Assessment: Danger to Self:  No Self-injurious Behavior: No Danger to Others: No Duty to Warn:no Physical Aggression / Violence:No  Access to Firearms a concern: No  Gang Involvement:No  Patient / guardian was educated about steps to take if suicide or homicide risk level increases between visits: yes While future psychiatric events cannot be accurately predicted, the patient does not currently require acute inpatient psychiatric care and does not currently meet [facility] involuntary commitment criteria.    Medications: Current Outpatient Medications  Medication Sig Dispense Refill   ALPRAZolam (XANAX) 0.5 MG tablet Take 1/2-1 tablet as needed for panic or severe anxiety (Patient not taking: Reported on 07/11/2022) 15 tablet 0   Dextromethorphan-buPROPion ER (AUVELITY) 45-105 MG TBCR Take 1 tablet by mouth 2 (two) times daily. 30 tablet 5   diphenhydrAMINE (BENADRYL) 25 MG tablet Take 25 mg by mouth at bedtime as needed for sleep.     omeprazole (PRILOSEC) 20 MG capsule Take 20 mg by mouth daily as needed.     sertraline (ZOLOFT) 100 MG tablet Take 2 tablets (200 mg total) by mouth daily. 180 tablet 1    traZODone (DESYREL) 50 MG tablet Take 1 tablet (50 mg total) by mouth at bedtime as needed. TAKE 1 TABLET BY MOUTH EVERYDAY AT BEDTIME 90 tablet 1   No current facility-administered medications for this visit.    Subjective:  Patient arrived on time for today's session.  Assess progress since last visit which was about 3 weeks ago.  Patient shared ongoing anxiety, stated that he typically avoids going out to eat with his mother and brother with whom he resides.  He stated they go out to eat quite often sometimes several times a week.  Patient shared how he avoids going to restaurants with them when they do get out, the last time being several months ago.  Facilitated his identifying associated thoughts related to this avoidant behavior.  He stated that he is aunt was going as well which makes him feel uncomfortable due to her often asking questions such as about weight loss which are given to him and his mother where he stated she also struggles with obesity.  Through further guided discovery, patient identified needs, specifically wanting to make changes with his daily routine, decreasing his on line video game playing and other on line activities in the evening.  He shared how he wants to begin working out and plans to ask his brother to go with him.  Engaged in collaborative exploration and planning centered around creating  a schedule with which she can adhere to to be consistent with working out.  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    Interventions:  supportive therapy, mot. Interviewing, CBT  Diagnoses:    ICD-10-CM   1. Generalized anxiety disorder  F41.1        Plan: Patient is to use CBT, mindfulness and coping skills to help manage / decrease symptoms. Patient is to increase work to isolate less often, possibly joining a gym and  look into options to get into graduate school.    Long-term goal:  Reduce overall level, frequency, and intensity of the feelings of depression, anxiety and panic up to 3 consecutive months per patient report.  Short-term goal: To identify and process feelings related to the disappointment of past painful events that increase worthless feelings.                   Patient to decrease isolated behavior, identifying and following through with ways to get out of his house more frequently.                                 Assessment of progress:  progressing    Anson Oregon, [facility]

## 2022-12-07 ENCOUNTER — Telehealth: Payer: Self-pay | Admitting: Psychiatry

## 2022-12-07 NOTE — Telephone Encounter (Signed)
CMM sent PA Renewal Request for Auvelity 45-105mg 

## 2022-12-11 NOTE — Telephone Encounter (Signed)
Pending determination

## 2022-12-13 ENCOUNTER — Telehealth: Payer: Self-pay | Admitting: Psychiatry

## 2022-12-13 NOTE — Telephone Encounter (Signed)
[facility] approved AUVELITY 45-105mg   from 12/13/22-12/13/23

## 2023-01-10 ENCOUNTER — Telehealth (INDEPENDENT_AMBULATORY_CARE_PROVIDER_SITE_OTHER): Payer: No Typology Code available for payment source | Admitting: Psychiatry

## 2023-01-10 ENCOUNTER — Encounter: Payer: Self-pay | Admitting: Psychiatry

## 2023-01-10 VITALS — HR 90

## 2023-01-10 DIAGNOSIS — F331 Major depressive disorder, recurrent, moderate: Secondary | ICD-10-CM

## 2023-01-10 DIAGNOSIS — F4010 Social phobia, unspecified: Secondary | ICD-10-CM

## 2023-01-10 DIAGNOSIS — F411 Generalized anxiety disorder: Secondary | ICD-10-CM | POA: Diagnosis not present

## 2023-01-10 DIAGNOSIS — G4700 Insomnia, unspecified: Secondary | ICD-10-CM

## 2023-01-10 MED ORDER — AUVELITY 45-105 MG PO TBCR: 1.0000 | EXTENDED_RELEASE_TABLET | Freq: Two times a day (BID) | ORAL | 5 refills | Status: DC

## 2023-01-10 MED ORDER — CARIPRAZINE HCL 1.5 MG PO CAPS: 1.5000 mg | ORAL_CAPSULE | Freq: Every day | ORAL | 2 refills | Status: DC

## 2023-01-10 MED ORDER — SERTRALINE HCL 100 MG PO TABS: 200.0000 mg | ORAL_TABLET | Freq: Every day | ORAL | 1 refills | Status: DC

## 2023-01-10 MED ORDER — ALPRAZOLAM 0.5 MG PO TABS: ORAL_TABLET | ORAL | 1 refills | Status: DC

## 2023-01-10 MED ORDER — TRAZODONE HCL 50 MG PO TABS: 50.0000 mg | ORAL_TABLET | Freq: Every evening | ORAL | 1 refills | Status: DC | PRN

## 2023-01-10 NOTE — Progress Notes (Signed)
[name] [id number] Jul 19, 1991 32 y.o.  Virtual Visit via Video Note  I connected with pt @ on 01/10/23 at  8:30 AM EDT by a video enabled telemedicine application and verified that I am speaking with the correct person using two identifiers.   I discussed the limitations of evaluation and management by telemedicine and the availability of in person appointments. The patient expressed understanding and agreed to proceed.  I discussed the assessment and treatment plan with the patient. The patient was provided an opportunity to ask questions and all were answered. The patient agreed with the plan and demonstrated an understanding of the instructions.   The patient was advised to call back or seek an in-person evaluation if the symptoms worsen or if the condition fails to improve as anticipated.  I provided 30 minutes of non-face-to-face time during this encounter.  The patient was located at home.  The provider was located at home.    Corie Chiquito, PMHNP   Subjective:   Patient ID:  Bryce Dillon is a 32 y.o. (DOB 1990/12/09) male.  Chief Complaint:  Chief Complaint  Patient presents with   Depression   Anxiety    HPI Bryce Dillon presents for follow-up of depression, anxiety, and insomnia. He reports that he has had more depression over the last month and attributes this to difficulties with social anxiety.  He reports having some social interaction. He reports that he often "second guesses" himself. He reports that he has also had some sadness in response to others not treating him well. Reports persistent sad mood. Some irritability. He reports that worry, rumination, and catastrophic thoughts have been ok. He has had some panic, usually when he is starting to go to bed. He reports that he has had some gagging and not being able to eat due to anxiety. He has had this in the past and "it seems to be coming back, but not as frequent." Appetite has been slightly less. Energy and  motivation have been somewhat low. Continues to enjoy gaming. He reports that concentration and focus have been ok. Denies SI.   He reports that work has been going better. He reports that on one occasion he had a panic attack on his way to work and had to pull over and eventually come home. He has been trying to go into work one day a week and will need to start going in 2 days a week.   He has been trying to walk his dog daily.   He reports that he has been out of Xanax prn and not taken it recently.   Past Psychiatric Medication Trials: Rexulti-  Has had tongue movements for several months. Some decrease in movements with addition of Gingko. Unsure of benefits.  Geodon Abilify Cymbalta Lexapro Sertraline- Helpful for anxiety Wellbutrin-racing thoughts Auvelity Buspar- Side effects (HA's, tired) Lithium-Caused tremor Lamictal-unsure of benefits Modafanil- unsure of benefit. Trazodone- Difficulty waking up if taken too late.  Xanax klonopin Propranolol- Ineffective  Review of Systems:  Review of Systems  Gastrointestinal:  Positive for nausea.       Gagging  Musculoskeletal:  Negative for gait problem.  Neurological:  Negative for tremors.  Psychiatric/Behavioral:         Please refer to HPI    Medications: I have reviewed the patient's current medications.  Current Outpatient Medications  Medication Sig Dispense Refill   cariprazine (VRAYLAR) 1.5 MG capsule Take 1 capsule (1.5 mg total) by mouth daily. 30 capsule 2   diphenhydrAMINE (  BENADRYL) 25 MG tablet Take 25 mg by mouth at bedtime as needed for sleep.     omeprazole (PRILOSEC) 20 MG capsule Take 20 mg by mouth daily as needed.     ALPRAZolam (XANAX) 0.5 MG tablet Take 1/2-1 tablet as needed for panic or severe anxiety 15 tablet 1   Dextromethorphan-buPROPion ER (AUVELITY) 45-105 MG TBCR Take 1 tablet by mouth 2 (two) times daily. 30 tablet 5   sertraline (ZOLOFT) 100 MG tablet Take 2 tablets (200 mg total) by  mouth daily. 180 tablet 1   traZODone (DESYREL) 50 MG tablet Take 1 tablet (50 mg total) by mouth at bedtime as needed. TAKE 1 TABLET BY MOUTH EVERYDAY AT BEDTIME 90 tablet 1   No current facility-administered medications for this visit.    Medication Side Effects: None  Allergies: No Known Allergies  Past Medical History:  Diagnosis Date   Anxiety    Depression     Family History  Problem Relation Age of Onset   Alcohol abuse Father     Social History   Socioeconomic History   Marital status: Single    Spouse name: Not on file   Number of children: Not on file   Years of education: Not on file   Highest education level: Not on file  Occupational History   Occupation: IT/Fund Raising  Tobacco Use   Smoking status: Never   Smokeless tobacco: Never  Vaping Use   Vaping Use: Never used  Substance and Sexual Activity   Alcohol use: No   Drug use: No   Sexual activity: Never  Other Topics Concern   Not on file  Social History Narrative   Not on file   Social Determinants of Health   Financial Resource Strain: Not on file  Food Insecurity: Not on file  Transportation Needs: Not on file  Physical Activity: Not on file  Stress: Not on file  Social Connections: Not on file  Intimate Partner Violence: Not on file    Past Medical History, Surgical history, Social history, and Family history were reviewed and updated as appropriate.   Please see review of systems for further details on the patient's review from today.   Objective:   Physical Exam:  Pulse 90   Physical Exam Neurological:     Mental Status: He is alert and oriented to person, place, and time.     Cranial Nerves: No dysarthria.  Psychiatric:        Attention and Perception: Attention and perception normal.        Mood and Affect: Mood is anxious and depressed.        Speech: Speech normal.        Behavior: Behavior is cooperative.        Thought Content: Thought content normal. Thought  content is not paranoid or delusional. Thought content does not include homicidal or suicidal ideation. Thought content does not include homicidal or suicidal plan.        Cognition and Memory: Cognition and memory normal.        Judgment: Judgment normal.     Comments: Insight intact     Lab Review:     Component Value Date/Time   NA 136 10/09/2020 0335   K 3.3 (L) 10/09/2020 0335   CL 104 10/09/2020 0335   CO2 22 10/09/2020 0335   GLUCOSE 110 (H) 10/09/2020 0335   BUN 8 10/09/2020 0335   CREATININE 0.90 10/09/2020 0335   CALCIUM 8.3 (L) 10/09/2020 0335  PROT 6.2 (L) 10/09/2020 0335   ALBUMIN 2.9 (L) 10/09/2020 0335   AST 15 10/09/2020 0335   ALT 25 10/09/2020 0335   ALKPHOS 69 10/09/2020 0335   BILITOT 1.4 (H) 10/09/2020 0335   GFRNONAA >60 10/09/2020 0335       Component Value Date/Time   WBC 9.7 10/09/2020 0335   RBC 4.88 10/09/2020 0335   HGB 13.2 10/09/2020 0335   HCT 40.0 10/09/2020 0335   PLT 215 10/09/2020 0335   MCV 82.0 10/09/2020 0335   MCH 27.0 10/09/2020 0335   MCHC 33.0 10/09/2020 0335   RDW 15.5 10/09/2020 0335   LYMPHSABS 1.8 10/08/2020 1236   MONOABS 0.6 10/08/2020 1236   EOSABS 0.2 10/08/2020 1236   BASOSABS 0.1 10/08/2020 1236    No results found for: "POCLITH", "LITHIUM"   No results found for: "PHENYTOIN", "PHENOBARB", "VALPROATE", "CBMZ"   .res Assessment: Plan:    Discussed potential benefits, risks, and side effects of Vraylar. Discussed potential metabolic side effects associated with atypical antipsychotics, as well as potential risk for movement side effects. Advised pt to contact office if movement side effects occur. Pt agrees to trial of Vraylar. Will start Vraylar 1.5 mg daily for augmentation of depression.  Will continue Auvelity 45-105 mg one tablet twice daily for depression. Continue Sertraline 200 mg po qd for depression and anxiety.  Continue Trazodone 50 mg po QHS for insomnia.  Will send in new script for Alprazolam prn  anxiety.  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  Pt to follow-up in 3 months or sooner if clinically indicated.  Patient advised to contact office with any questions, adverse effects, or acute worsening in signs and symptoms.   Bryce Dillon was seen today for depression and anxiety.  Diagnoses and all orders for this visit:  Moderate episode of recurrent major depressive disorder (HCC) -     cariprazine (VRAYLAR) 1.5 MG capsule; Take 1 capsule (1.5 mg total) by mouth daily. -     sertraline (ZOLOFT) 100 MG tablet; Take 2 tablets (200 mg total) by mouth daily. -     Dextromethorphan-buPROPion ER (AUVELITY) 45-105 MG TBCR; Take 1 tablet by mouth 2 (two) times daily.  Generalized anxiety disorder -     sertraline (ZOLOFT) 100 MG tablet; Take 2 tablets (200 mg total) by mouth daily.  Recurrent major depressive disorder, in partial remission (HCC)  Insomnia, unspecified type -     traZODone (DESYREL) 50 MG tablet; Take 1 tablet (50 mg total) by mouth at bedtime as needed. TAKE 1 TABLET BY MOUTH EVERYDAY AT BEDTIME  Social anxiety disorder -     ALPRAZolam (XANAX) 0.5 MG tablet; Take 1/2-1 tablet as needed for panic or severe anxiety     Please see After Visit Summary for patient specific instructions.  No future appointments.  No orders of the defined types were placed in this encounter.     -------------------------------

## 2023-01-24 NOTE — Telephone Encounter (Signed)
This was approved, see other phone note. Effective 12/2022-12/13/2023 for Auvelity 45-10 mg

## 2023-04-19 ENCOUNTER — Telehealth (INDEPENDENT_AMBULATORY_CARE_PROVIDER_SITE_OTHER): Payer: No Typology Code available for payment source | Admitting: Psychiatry

## 2023-04-19 ENCOUNTER — Encounter: Payer: Self-pay | Admitting: Psychiatry

## 2023-04-19 DIAGNOSIS — F4010 Social phobia, unspecified: Secondary | ICD-10-CM

## 2023-04-19 DIAGNOSIS — F331 Major depressive disorder, recurrent, moderate: Secondary | ICD-10-CM | POA: Diagnosis not present

## 2023-04-19 DIAGNOSIS — F411 Generalized anxiety disorder: Secondary | ICD-10-CM

## 2023-04-19 MED ORDER — DESVENLAFAXINE SUCCINATE ER 50 MG PO TB24: ORAL_TABLET | ORAL | 0 refills | Status: DC

## 2023-04-19 MED ORDER — ALPRAZOLAM 0.5 MG PO TABS: ORAL_TABLET | ORAL | 1 refills | Status: DC

## 2023-04-19 MED ORDER — SERTRALINE HCL 100 MG PO TABS: ORAL_TABLET | ORAL | Status: DC

## 2023-04-19 MED ORDER — DESVENLAFAXINE SUCCINATE ER 100 MG PO TB24: 100.0000 mg | ORAL_TABLET | Freq: Every day | ORAL | 2 refills | Status: DC

## 2023-04-19 NOTE — Progress Notes (Signed)
Seldon Illes [id number] [birth year] 32 y.o.  Virtual Visit via Video Note  I connected with pt @ on 04/19/23 at  9:00 AM EDT by a video enabled telemedicine application and verified that I am speaking with the correct person using two identifiers.   I discussed the limitations of evaluation and management by telemedicine and the availability of in person appointments. The patient expressed understanding and agreed to proceed.  I discussed the assessment and treatment plan with the patient. The patient was provided an opportunity to ask questions and all were answered. The patient agreed with the plan and demonstrated an understanding of the instructions.   The patient was advised to call back or seek an in-person evaluation if the symptoms worsen or if the condition fails to improve as anticipated.  I provided 34 minutes of non-face-to-face time during this encounter.  The patient was located at home.  The provider was located at [facility].   Corie Chiquito, PMHNP   Subjective:   Patient ID:  Jaivian Guillot is a 32 y.o. (DOB February 08, 1991) male.  Chief Complaint:  Chief Complaint  Patient presents with   Depression   Anxiety   Sleeping Problem    HPI Kaiyden Aftab presents for follow-up of depression, anxiety, and insomnia. He reports that his depression "seems to be worse. The gagging has returned" and attributes this to social anxiety. He reports gagging happens mostly at home, often after video/online interaction with others. He reports persistent sad mood. Denies significant irritability. Denies panic attacks. He reports that he has been going into the office one day a week. He anticipates having to go into the office more with office moving. He reports that his social interaction is entirely online. He reports heightened worry and anxiety. Some catastrophic thoughts and rumination. Energy has been "about the same." He reports that motivation has improved with starting [employer]. He reports enjoying playing games online with friends. He denies SI.   He reports worsening insomnia since starting Vraylar. He reports frequent middle of the night awakenings. Estimates sleeping 4-5 hours. He reports that Trazodone helps with sleep, however he notices nightmares when he takes Trazodone.   He reports that he has been limiting food intake due to gagging.   He has started treatment through [employer] [facility] and is awaiting [facility] authorization.   He has been helping take care of great-aunt that it 49 years old.   He reports that he has been having to take Alprazolam prn more often.   Past Psychiatric Medication Trials: Rexulti-  Has had tongue movements for several months. Some decrease in movements with addition of Gingko. Unsure of benefits.  Geodon Abilify Vraylar- Ineffective.  Cymbalta-Effective and then no longer as effective Lexapro-Effective and then no longer as effective Sertraline- Helpful for anxiety Wellbutrin-racing thoughts Auvelity Buspar- Side effects (HA's, tired) Lithium-Caused tremor Lamictal-unsure of benefits Modafanil- unsure of benefit. Trazodone- Difficulty waking up if taken too late.  Xanax klonopin Propranolol- Ineffective  Review of Systems:  Review of Systems  Gastrointestinal:  Positive for diarrhea.       Gagging  Musculoskeletal:  Negative for gait problem.  Neurological:  Negative for tremors.  Psychiatric/Behavioral:         Please refer to HPI    Medications: I have reviewed the patient's current medications.  Current Outpatient Medications  Medication Sig Dispense Refill   desvenlafaxine (PRISTIQ) 100 MG 24 hr tablet Take 1 tablet (100 mg total) by mouth daily. 30 tablet 2   desvenlafaxine (  PRISTIQ) 50 MG 24 hr tablet Take 1/2 tablet daily for one week, then increase to 1 tablets daily for one week, then increase to 2 tablets daily 30 tablet 0   diphenhydrAMINE (BENADRYL) 25 MG tablet Take 25 mg by mouth at bedtime as  needed for sleep.     omeprazole (PRILOSEC) 20 MG capsule Take 20 mg by mouth daily as needed.     ALPRAZolam (XANAX) 0.5 MG tablet Take 1/2-1 tablet as needed for panic or severe anxiety 15 tablet 1   Dextromethorphan-buPROPion ER (AUVELITY) 45-105 MG TBCR Take 1 tablet by mouth 2 (two) times daily. 30 tablet 5   sertraline (ZOLOFT) 100 MG tablet Take 1.5 tablets daily for one week, then take 1 tablet daily for one week, then take 1/2 daily for one week, then take 1/4 tablet daily then stop.     traZODone (DESYREL) 50 MG tablet Take 1 tablet (50 mg total) by mouth at bedtime as needed. TAKE 1 TABLET BY MOUTH EVERYDAY AT BEDTIME 90 tablet 1   No current facility-administered medications for this visit.    Medication Side Effects: Sleep Problems  Allergies: No Known Allergies  Past Medical History:  Diagnosis Date   Anxiety    Depression     Family History  Problem Relation Age of Onset   Alcohol abuse Father     Social History   Socioeconomic History   Marital status: Single    Spouse name: Not on file   Number of children: Not on file   Years of education: Not on file   Highest education level: Not on file  Occupational History   Occupation: IT/Fund Raising  Tobacco Use   Smoking status: Never   Smokeless tobacco: Never  Vaping Use   Vaping status: Never Used  Substance and Sexual Activity   Alcohol use: No   Drug use: No   Sexual activity: Never  Other Topics Concern   Not on file  Social History Narrative   Not on file   Social Determinants of Health   Financial Resource Strain: Not on file  Food Insecurity: Not on file  Transportation Needs: Not on file  Physical Activity: Not on file  Stress: Not on file  Social Connections: Unknown (02/04/2023)   Received from [facility]   Social Network    Social Network: Not on file  Intimate Partner Violence: Unknown (02/04/2023)   Received from [facility]   HITS    Physically Hurt: Not on file    Insult or  Talk Down To: Not on file    Threaten Physical Harm: Not on file    Scream or Curse: Not on file    Past Medical History, Surgical history, Social history, and Family history were reviewed and updated as appropriate.   Please see review of systems for further details on the patient's review from today.   Objective:   Physical Exam:  There were no vitals taken for this visit.  Physical Exam Neurological:     Mental Status: He is alert and oriented to person, place, and time.     Cranial Nerves: No dysarthria.  Psychiatric:        Attention and Perception: Attention and perception normal.        Mood and Affect: Mood is anxious and depressed.        Speech: Speech normal.        Behavior: Behavior is cooperative.        Thought Content: Thought content  normal. Thought content is not paranoid or delusional. Thought content does not include homicidal or suicidal ideation. Thought content does not include homicidal or suicidal plan.        Cognition and Memory: Cognition and memory normal.        Judgment: Judgment normal.     Comments: Insight intact     Lab Review:     Component Value Date/Time   NA 136 10/09/2020 0335   K 3.3 (L) 10/09/2020 0335   CL 104 10/09/2020 0335   CO2 22 10/09/2020 0335   GLUCOSE 110 (H) 10/09/2020 0335   BUN 8 10/09/2020 0335   CREATININE 0.90 10/09/2020 0335   CALCIUM 8.3 (L) 10/09/2020 0335   PROT 6.2 (L) 10/09/2020 0335   ALBUMIN 2.9 (L) 10/09/2020 0335   AST 15 10/09/2020 0335   ALT 25 10/09/2020 0335   ALKPHOS 69 10/09/2020 0335   BILITOT 1.4 (H) 10/09/2020 0335   GFRNONAA >60 10/09/2020 0335       Component Value Date/Time   WBC 9.7 10/09/2020 0335   RBC 4.88 10/09/2020 0335   HGB 13.2 10/09/2020 0335   HCT 40.0 10/09/2020 0335   PLT 215 10/09/2020 0335   MCV 82.0 10/09/2020 0335   MCH 27.0 10/09/2020 0335   MCHC 33.0 10/09/2020 0335   RDW 15.5 10/09/2020 0335   LYMPHSABS 1.8 10/08/2020 1236   MONOABS 0.6 10/08/2020 1236    EOSABS 0.2 10/08/2020 1236   BASOSABS 0.1 10/08/2020 1236    No results found for: "POCLITH", "LITHIUM"   No results found for: "PHENYTOIN", "PHENOBARB", "VALPROATE", "CBMZ"   .res Assessment: Plan:    38 minutes spent dedicated to the care of this patient on the date of this encounter to include pre-visit review of records, ordering of medication, post visit documentation, and face-to-face time with the patient discussing treatment options to include discontinuing Vraylar due to no improvement and possible side effects, and alternatives to Sertraline since this no longer seems to be effective for his anxiety or depression. Discussed potential benefits, risks, and side effects of Pristiq. Pt agrees to trial of Pristiq. Discussed cross-titration of medications to minimize risk of discontinuation and possible side effects.  Week 1: Sertraline 150 mg daily (1.5 tabs)  Desvenlafaxine 25 mg daily  Week 2: Sertraline 100 mg daily (1 tab)  Desvenlafaxine 50 mg daily  Week 3: Sertraline 50 mg daily (1/2 tab)  Desvenlafaxine 100 mg daily  Week 4: Sertraline 25 mg daily (1/4 tab)  Continue Desvenlafaxine 100 mg daily.   Stop Vraylar. Will continue Auvelity 45-105 mg one tablet twice daily for depression. Continue Alprazolam prn anxiety.  Discussed whether to continue or change Trazodone. Pt reports that he is hopeful that insomnia improves with discontinuation of Vraylar and that he will continue to use Trazodone on rare occasions when insomnia has been more severe.  Pt to follow-up with this provider in 6 weeks or sooner if clinically indicated.  Patient advised to contact office with any questions, adverse effects, or acute worsening in signs and symptoms.   Tyjon was seen today for depression, anxiety and sleeping problem.  Diagnoses and all orders for this visit:  Moderate episode of recurrent major depressive disorder (HCC) -     desvenlafaxine (PRISTIQ) 50 MG 24 hr tablet; Take 1/2  tablet daily for one week, then increase to 1 tablets daily for one week, then increase to 2 tablets daily -     desvenlafaxine (PRISTIQ) 100 MG 24 hr tablet; Take 1  tablet (100 mg total) by mouth daily. -     sertraline (ZOLOFT) 100 MG tablet; Take 1.5 tablets daily for one week, then take 1 tablet daily for one week, then take 1/2 daily for one week, then take 1/4 tablet daily then stop.  Generalized anxiety disorder -     desvenlafaxine (PRISTIQ) 50 MG 24 hr tablet; Take 1/2 tablet daily for one week, then increase to 1 tablets daily for one week, then increase to 2 tablets daily -     desvenlafaxine (PRISTIQ) 100 MG 24 hr tablet; Take 1 tablet (100 mg total) by mouth daily. -     sertraline (ZOLOFT) 100 MG tablet; Take 1.5 tablets daily for one week, then take 1 tablet daily for one week, then take 1/2 daily for one week, then take 1/4 tablet daily then stop.  Social anxiety disorder -     desvenlafaxine (PRISTIQ) 50 MG 24 hr tablet; Take 1/2 tablet daily for one week, then increase to 1 tablets daily for one week, then increase to 2 tablets daily -     desvenlafaxine (PRISTIQ) 100 MG 24 hr tablet; Take 1 tablet (100 mg total) by mouth daily. -     ALPRAZolam (XANAX) 0.5 MG tablet; Take 1/2-1 tablet as needed for panic or severe anxiety     Please see After Visit Summary for patient specific instructions.  No future appointments.   No orders of the defined types were placed in this encounter.     -------------------------------

## 2023-04-19 NOTE — Patient Instructions (Addendum)
Week 1: Sertraline 150 mg daily (1.5 tabs)  Desvenlafaxine 25 mg daily  Week 2: Sertraline 100 mg daily (1 tab)  Desvenlafaxine 50 mg daily  Week 3: Sertraline 50 mg daily (1/2 tab)  Desvenlafaxine 100 mg daily  Week 4: Sertraline 25 mg daily (1/4 tab)  Continue Desvenlafaxine 100 mg daily.

## 2023-05-04 ENCOUNTER — Other Ambulatory Visit: Payer: Self-pay | Admitting: Psychiatry

## 2023-05-04 DIAGNOSIS — F331 Major depressive disorder, recurrent, moderate: Secondary | ICD-10-CM

## 2023-05-04 DIAGNOSIS — F411 Generalized anxiety disorder: Secondary | ICD-10-CM

## 2023-05-04 DIAGNOSIS — F4010 Social phobia, unspecified: Secondary | ICD-10-CM

## 2023-05-20 ENCOUNTER — Other Ambulatory Visit: Payer: Self-pay | Admitting: Psychiatry

## 2023-05-20 DIAGNOSIS — F411 Generalized anxiety disorder: Secondary | ICD-10-CM

## 2023-05-20 DIAGNOSIS — F4010 Social phobia, unspecified: Secondary | ICD-10-CM

## 2023-05-20 DIAGNOSIS — F331 Major depressive disorder, recurrent, moderate: Secondary | ICD-10-CM

## 2023-06-06 ENCOUNTER — Encounter: Payer: Self-pay | Admitting: Psychiatry

## 2023-06-06 ENCOUNTER — Telehealth (INDEPENDENT_AMBULATORY_CARE_PROVIDER_SITE_OTHER): Payer: No Typology Code available for payment source | Admitting: Psychiatry

## 2023-06-06 DIAGNOSIS — F330 Major depressive disorder, recurrent, mild: Secondary | ICD-10-CM

## 2023-06-06 DIAGNOSIS — F411 Generalized anxiety disorder: Secondary | ICD-10-CM | POA: Diagnosis not present

## 2023-06-06 DIAGNOSIS — F4010 Social phobia, unspecified: Secondary | ICD-10-CM | POA: Diagnosis not present

## 2023-06-06 DIAGNOSIS — F331 Major depressive disorder, recurrent, moderate: Secondary | ICD-10-CM

## 2023-06-06 MED ORDER — DESVENLAFAXINE SUCCINATE ER 100 MG PO TB24: 100.0000 mg | ORAL_TABLET | Freq: Every day | ORAL | 1 refills | Status: AC

## 2023-06-06 MED ORDER — ALPRAZOLAM 0.5 MG PO TABS: 0.5000 mg | ORAL_TABLET | Freq: Every evening | ORAL | 2 refills | Status: AC | PRN

## 2023-06-06 MED ORDER — AUVELITY 45-105 MG PO TBCR: 1.0000 | EXTENDED_RELEASE_TABLET | Freq: Two times a day (BID) | ORAL | 1 refills | Status: AC

## 2023-06-06 NOTE — Progress Notes (Signed)
Bryce Dillon [id number] 1990/09/04 32 y.o.  Virtual Visit via Video Note  I connected with pt @ on 06/06/23 at  1:30 PM EDT by a video enabled telemedicine application and verified that I am speaking with the correct person using two identifiers.   I discussed the limitations of evaluation and management by telemedicine and the availability of in person appointments. The patient expressed understanding and agreed to proceed.  I discussed the assessment and treatment plan with the patient. The patient was provided an opportunity to ask questions and all were answered. The patient agreed with the plan and demonstrated an understanding of the instructions.   The patient was advised to call back or seek an in-person evaluation if the symptoms worsen or if the condition fails to improve as anticipated.  I provided 28 minutes of non-face-to-face time during this encounter.  The patient was located at work in [facility].  The provider was located at home.    Corie Chiquito, PMHNP   Subjective:   Patient ID:  Bryce Dillon is a 32 y.o. (DOB [birth year]) male.  Chief Complaint:  Chief Complaint  Patient presents with   Insomnia   Follow-up    Depression and anxiety    HPI Bryce Dillon presents for follow-up of depression, anxiety, and insomnia. He reports, "I've been doing pretty good." He reports some improvement with Pristiq. He traveled recently and had minimal anxiety while traveling. He reports minimal depression. He reports that his energy and motivation "is a little up." He reports that he is being treated for weight loss and that insurance will not cover some medications. He is is doing personal training once a week. Appetite has been "so, so." He repotrs that he has been trying to reduce food intake. He was drinking beer 2-3 times a week and has eliminated this.  He reports that his concentration has been "a little bit better too." Denies SI.   He has had difficulty with sleep, both with sleep  initiation and maintenance. Estimates sleeping 5-6 hours a night. He notices anxiety when trying to fall asleep. Has nightmares when he takes Trazodone. He reports that he is occasionally waking up gasping with cPap.   Returned to work Tuesday after vacation. Learned that his manager is leaving and coworker is assuming this position and is not sure how this will impact his job role. He has a meeting with his new manager Monday. Has some anxiety about this change. Denies panic.   Mother was in hospital for a week a couple of weeks ago for GI issues. He had to help with caregiving for great-aunt.   He has been using Alprazolam to help fall asleep. Reports that it does not help him stay asleep if he only takes a half.  Alprazolam last filled 04/19/23.  Past Psychiatric Medication Trials: Rexulti-  Has had tongue movements for several months. Some decrease in movements with addition of Gingko. Unsure of benefits.  Geodon Abilify Vraylar- Ineffective.  Cymbalta-Effective and then no longer as effective Lexapro-Effective and then no longer as effective Sertraline- Helpful for anxiety Wellbutrin-racing thoughts Auvelity Buspar- Side effects (HA's, tired) Lithium-Caused tremor Lamictal-unsure of benefits Modafanil- unsure of benefit. Trazodone- Difficulty waking up if taken too late.  Xanax klonopin Propranolol- Ineffective  Review of Systems:  Review of Systems  Musculoskeletal:  Negative for gait problem.  Neurological:  Negative for tremors and headaches.  Psychiatric/Behavioral:         Please refer to HPI    Medications: I have  reviewed the patient's current medications.  Current Outpatient Medications  Medication Sig Dispense Refill   omeprazole (PRILOSEC) 20 MG capsule Take 20 mg by mouth daily as needed.     ALPRAZolam (XANAX) 0.5 MG tablet Take 1 tablet (0.5 mg total) by mouth at bedtime as needed for anxiety. 30 tablet 2   desvenlafaxine (PRISTIQ) 100 MG 24 hr tablet Take  1 tablet (100 mg total) by mouth daily. 90 tablet 1   Dextromethorphan-buPROPion ER (AUVELITY) 45-105 MG TBCR Take 1 tablet by mouth 2 (two) times daily. 90 tablet 1   diphenhydrAMINE (BENADRYL) 25 MG tablet Take 25 mg by mouth at bedtime as needed for sleep. (Patient not taking: Reported on 06/06/2023)     No current facility-administered medications for this visit.    Medication Side Effects: None  Allergies: No Known Allergies  Past Medical History:  Diagnosis Date   Anxiety    Depression     Family History  Problem Relation Age of Onset   Alcohol abuse Father     Social History   Socioeconomic History   Marital status: Single    Spouse name: Not on file   Number of children: Not on file   Years of education: Not on file   Highest education level: Not on file  Occupational History   Occupation: IT/Fund Raising  Tobacco Use   Smoking status: Never   Smokeless tobacco: Never  Vaping Use   Vaping status: Never Used  Substance and Sexual Activity   Alcohol use: No   Drug use: No   Sexual activity: Never  Other Topics Concern   Not on file  Social History Narrative   Not on file   Social Determinants of Health   Financial Resource Strain: Not on file  Food Insecurity: Not on file  Transportation Needs: Not on file  Physical Activity: Not on file  Stress: Not on file  Social Connections: Unknown (02/04/2023)   Received from [facility]   Social Network    Social Network: Not on file  Intimate Partner Violence: Unknown (02/04/2023)   Received from [facility]   HITS    Physically Hurt: Not on file    Insult or Talk Down To: Not on file    Threaten Physical Harm: Not on file    Scream or Curse: Not on file    Past Medical History, Surgical history, Social history, and Family history were reviewed and updated as appropriate.   Please see review of systems for further details on the patient's review from today.   Objective:   Physical Exam:  There  were no vitals taken for this visit.  Physical Exam Neurological:     Mental Status: He is alert and oriented to person, place, and time.     Cranial Nerves: No dysarthria.  Psychiatric:        Attention and Perception: Attention and perception normal.        Speech: Speech normal.        Behavior: Behavior is cooperative.        Thought Content: Thought content normal. Thought content is not paranoid or delusional. Thought content does not include homicidal or suicidal ideation. Thought content does not include homicidal or suicidal plan.        Cognition and Memory: Cognition and memory normal.        Judgment: Judgment normal.     Comments: Insight intact Mood presents as less anxious and less depressed compared to last visit  Lab Review:     Component Value Date/Time   NA 136 10/09/2020 0335   K 3.3 (L) 10/09/2020 0335   CL 104 10/09/2020 0335   CO2 22 10/09/2020 0335   GLUCOSE 110 (H) 10/09/2020 0335   BUN 8 10/09/2020 0335   CREATININE 0.90 10/09/2020 0335   CALCIUM 8.3 (L) 10/09/2020 0335   PROT 6.2 (L) 10/09/2020 0335   ALBUMIN 2.9 (L) 10/09/2020 0335   AST 15 10/09/2020 0335   ALT 25 10/09/2020 0335   ALKPHOS 69 10/09/2020 0335   BILITOT 1.4 (H) 10/09/2020 0335   GFRNONAA >60 10/09/2020 0335       Component Value Date/Time   WBC 9.7 10/09/2020 0335   RBC 4.88 10/09/2020 0335   HGB 13.2 10/09/2020 0335   HCT 40.0 10/09/2020 0335   PLT 215 10/09/2020 0335   MCV 82.0 10/09/2020 0335   MCH 27.0 10/09/2020 0335   MCHC 33.0 10/09/2020 0335   RDW 15.5 10/09/2020 0335   LYMPHSABS 1.8 10/08/2020 1236   MONOABS 0.6 10/08/2020 1236   EOSABS 0.2 10/08/2020 1236   BASOSABS 0.1 10/08/2020 1236    No results found for: "POCLITH", "LITHIUM"   No results found for: "PHENYTOIN", "PHENOBARB", "VALPROATE", "CBMZ"   .res Assessment: Plan:    31 minutes spent dedicated to the care of this patient on the date of this encounter to include pre-visit review of records,  ordering of medication, post visit documentation, and face-to-face time with the patient discussing response to cross-titration from Sertraline to Pristiq and treatment options for insomnia.  Pt reports that Xanax has been most helpful and well tolerated for insomnia. Will increase qty of Xanax 0.5 mg to #30 to use as needed for insomnia. Continue Pristiq 100 mg daily for depression and anxiety.  Will continue Auvelity 45-105 mg one tablet twice daily for depression. Pt to follow-up with this provider in 4 weeks or sooner if clinically indicated.  Patient advised to contact office with any questions, adverse effects, or acute worsening in signs and symptoms.   Saajan was seen today for insomnia and follow-up.  Diagnoses and all orders for this visit:  Mild episode of recurrent major depressive disorder (HCC) -     desvenlafaxine (PRISTIQ) 100 MG 24 hr tablet; Take 1 tablet (100 mg total) by mouth daily. -     Dextromethorphan-buPROPion ER (AUVELITY) 45-105 MG TBCR; Take 1 tablet by mouth 2 (two) times daily.  Generalized anxiety disorder -     desvenlafaxine (PRISTIQ) 100 MG 24 hr tablet; Take 1 tablet (100 mg total) by mouth daily.  Social anxiety disorder -     desvenlafaxine (PRISTIQ) 100 MG 24 hr tablet; Take 1 tablet (100 mg total) by mouth daily. -     ALPRAZolam (XANAX) 0.5 MG tablet; Take 1 tablet (0.5 mg total) by mouth at bedtime as needed for anxiety.     Please see After Visit Summary for patient specific instructions.  No future appointments.   No orders of the defined types were placed in this encounter.     -------------------------------

## 2023-06-19 ENCOUNTER — Encounter: Payer: Self-pay | Admitting: Psychiatry
